# Patient Record
Sex: Female | Born: 1995 | Race: White | Hispanic: No | Marital: Single | Smoking: Never smoker
Health system: Southern US, Community
[De-identification: ages and names within clinical notes are randomized; demographics above are authoritative.]

## PROBLEM LIST (undated history)

## (undated) DIAGNOSIS — M24469 Recurrent dislocation, unspecified knee: Secondary | ICD-10-CM

## (undated) DIAGNOSIS — F70 Mild intellectual disabilities: Secondary | ICD-10-CM

## (undated) DIAGNOSIS — R0981 Nasal congestion: Secondary | ICD-10-CM

## (undated) DIAGNOSIS — H6690 Otitis media, unspecified, unspecified ear: Secondary | ICD-10-CM

## (undated) HISTORY — PX: TYMPANOSTOMY TUBE PLACEMENT: SHX32

## (undated) HISTORY — PX: KNEE SURGERY: SHX244

## (undated) HISTORY — PX: ADENOIDECTOMY: SUR15

---
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
## 2017-12-18 ENCOUNTER — Encounter: Payer: Self-pay | Admitting: Orthopaedic Surgery

## 2017-12-18 ENCOUNTER — Ambulatory Visit (INDEPENDENT_AMBULATORY_CARE_PROVIDER_SITE_OTHER): Payer: Medicaid Other

## 2017-12-18 ENCOUNTER — Ambulatory Visit (INDEPENDENT_AMBULATORY_CARE_PROVIDER_SITE_OTHER): Payer: Medicaid Other | Admitting: Orthopaedic Surgery

## 2017-12-18 VITALS — BP 114/76 | HR 82 | Ht 64.0 in | Wt 129.0 lb

## 2017-12-18 DIAGNOSIS — M25562 Pain in left knee: Secondary | ICD-10-CM

## 2017-12-18 NOTE — Progress Notes (Signed)
Subjective:    Patient ID: Carolyn Benitez, female    DOB: [birth year], 22 y.o.   MRN: [id number]  HPI She fell and hurt her left knee about two weeks ago. She felt a pop and then has had swelling and decreased motion of the knee.  She has been seen at [facility] on 12-04-17 for this and again last week.  She is referred for the pain. She has a history in the past of dislocation of the patella.  She has braces for this.  She has not had the type of pain she has now before.  She has more lateral pain and swelling and pain posterior also.  She cannot fully extend the knee at times.  She has giving way.  She uses a crutch but did not bring it in today.  She is taking ibuprofen which helps.   Review of Systems  Constitutional: Positive for activity change.  Musculoskeletal: Positive for arthralgias, gait problem and joint swelling.  All other systems reviewed and are negative.  For Review of Systems, all other systems reviewed and are negative.  The following is a summary of the past history medically, past history surgically, known current medicines, social history and family history.  This information is gathered electronically by the computer from prior information and documentation.  I review this each visit and have found including this information at this point in the chart is beneficial and informative.   Past Medical History:  Diagnosis Date  . Chronic otitis media 12/2013  . Mild intellectual disability   . Nasal congestion 12/18/2013  . Recurrent dislocation of knee    bilateral    Past Surgical History:  Procedure Laterality Date  . ADENOIDECTOMY    . COLONOSCOPY W/ BIOPSIES  02/10/2010  . MYRINGOTOMY WITH TUBE PLACEMENT Bilateral 12/22/2013   Procedure: BILATERAL MYRINGOTOMY WITH TUBE PLACEMENT;  Surgeon: Darletta Moll, MD;  Location: [facility];  Service: ENT;  Laterality: Bilateral;  . TYMPANOSTOMY TUBE PLACEMENT Bilateral    x 2    No current  outpatient medications on file prior to visit.   No current facility-administered medications on file prior to visit.     Social History   Socioeconomic History  . Marital status: Single    Spouse name: Not on file  . Number of children: Not on file  . Years of education: Not on file  . Highest education level: Not on file  Occupational History  . Not on file  Social Needs  . Financial resource strain: Not on file  . Food insecurity:    Worry: Not on file    Inability: Not on file  . Transportation needs:    Medical: Not on file    Non-medical: Not on file  Tobacco Use  . Smoking status: Never Smoker  . Smokeless tobacco: Never Used  Substance and Sexual Activity  . Alcohol use: No  . Drug use: No  . Sexual activity: Not on file  Lifestyle  . Physical activity:    Days per week: Not on file    Minutes per session: Not on file  . Stress: Not on file  Relationships  . Social connections:    Talks on phone: Not on file    Gets together: Not on file    Attends religious service: Not on file    Active member of club or organization: Not on file    Attends meetings of clubs or organizations: Not on file  Relationship status: Not on file  . Intimate partner violence:    Fear of current or ex partner: Not on file    Emotionally abused: Not on file    Physically abused: Not on file    Forced sexual activity: Not on file  Other Topics Concern  . Not on file  Social History Narrative  . Not on file    Family History  Problem Relation Age of Onset  . Hypertension Paternal Grandfather     BP 114/76   Pulse 82   Ht 5\' 4"  (1.626 m)   Wt 129 lb (58.5 kg)   LMP 12/10/2017 (Exact Date)   BMI 22.14 kg/m   Body mass index is 22.14 kg/m.      Objective:   Physical Exam Constitutional:      Appearance: She is well-developed.  HENT:     Head: Normocephalic and atraumatic.  Eyes:     Conjunctiva/sclera: Conjunctivae normal.     Pupils: Pupils are equal,  round, and reactive to light.  Neck:     Musculoskeletal: Normal range of motion and neck supple.  Cardiovascular:     Rate and Rhythm: Normal rate and regular rhythm.  Pulmonary:     Effort: Pulmonary effort is normal.  Abdominal:     Palpations: Abdomen is soft.  Musculoskeletal:     Left knee: She exhibits decreased range of motion, swelling and effusion. Tenderness found. Lateral joint line tenderness noted.       Legs:  Skin:    General: Skin is warm and dry.  Neurological:     Mental Status: She is alert and oriented to person, place, and time.     Cranial Nerves: No cranial nerve deficit.     Motor: No abnormal muscle tone.     Coordination: Coordination normal.     Deep Tendon Reflexes: Reflexes are normal and symmetric. Reflexes normal.  Psychiatric:        Behavior: Behavior normal.        Thought Content: Thought content normal.        Judgment: Judgment normal.      X-rays were done of the left knee, reported separately.     Assessment & Plan:   Encounter Diagnosis  Name Primary?  . Acute pain of left knee Yes   I am concerned about a meniscus tear. Her insurance requires a six week waiting period.  I have explained this to her.  I will see her in three weeks.  Use the crutch, continue the ibuprofen.  Call if any problem.  Precautions discussed.   Electronically Signed Darreld McleanWayne Tatum Corl, MD 12/18/201911:00 AM

## 2018-01-08 ENCOUNTER — Encounter: Payer: Self-pay | Admitting: Orthopaedic Surgery

## 2018-01-08 ENCOUNTER — Ambulatory Visit (INDEPENDENT_AMBULATORY_CARE_PROVIDER_SITE_OTHER): Payer: Medicaid Other | Admitting: Orthopaedic Surgery

## 2018-01-08 VITALS — BP 108/65 | HR 62 | Ht 64.0 in | Wt 127.0 lb

## 2018-01-08 DIAGNOSIS — M25562 Pain in left knee: Secondary | ICD-10-CM

## 2018-01-08 NOTE — Progress Notes (Signed)
[name], female DOB:Aug 22, 1995, 23 y.o. AVW:[id number]  Chief Complaint  Patient presents with  . Knee Pain    Left knee pain    HPI  Carolyn Benitez is a 23 y.o. female who continues to have left knee pain.  She is a little better but still has medial pain and feeling the knee may give way.  She has swelling and popping.  Her insurance requires six weeks from first seen and she needs an additional two weeks before an MRI can be ordered.  She is taking Advil which helps.   Body mass index is 21.8 kg/m.  ROS  Review of Systems  Constitutional: Positive for activity change.  Musculoskeletal: Positive for arthralgias, gait problem and joint swelling.  All other systems reviewed and are negative.   All other systems reviewed and are negative.  The following is a summary of the past history medically, past history surgically, known current medicines, social history and family history.  This information is gathered electronically by the computer from prior information and documentation.  I review this each visit and have found including this information at this point in the chart is beneficial and informative.    Past Medical History:  Diagnosis Date  . Chronic otitis media 12/2013  . Mild intellectual disability   . Nasal congestion 12/18/2013  . Recurrent dislocation of knee    bilateral    Past Surgical History:  Procedure Laterality Date  . ADENOIDECTOMY    . COLONOSCOPY W/ BIOPSIES  02/10/2010  . MYRINGOTOMY WITH TUBE PLACEMENT Bilateral 12/22/2013   Procedure: BILATERAL MYRINGOTOMY WITH TUBE PLACEMENT;  Surgeon: Darletta MollSui W Teoh, MD;  Location: [facility];  Service: ENT;  Laterality: Bilateral;  . TYMPANOSTOMY TUBE PLACEMENT Bilateral    x 2    Family History  Problem Relation Age of Onset  . Hypertension Paternal Grandfather     Social History Social History   Tobacco Use  . Smoking status: Never Smoker  . Smokeless tobacco: Never Used   Substance Use Topics  . Alcohol use: No  . Drug use: No    No Known Allergies  No current outpatient medications on file.   No current facility-administered medications for this visit.      Physical Exam  Blood pressure 108/65, pulse 62, height 5\' 4"  (1.626 m), weight 127 lb (57.6 kg), last menstrual period 12/10/2017.  Constitutional: overall normal hygiene, normal nutrition, well developed, normal grooming, normal body habitus. Assistive device:none  Musculoskeletal: gait and station Limp left, muscle tone and strength are normal, no tremors or atrophy is present.  .  Neurological: coordination overall normal.  Deep tendon reflex/nerve stretch intact.  Sensation normal.  Cranial nerves II-XII intact.   Skin:   Normal overall no scars, lesions, ulcers or rashes. No psoriasis.  Psychiatric: Alert and oriented x 3.  Recent memory intact, remote memory unclear.  Normal mood and affect. Well groomed.  Good eye contact.  Cardiovascular: overall no swelling, no varicosities, no edema bilaterally, normal temperatures of the legs and arms, no clubbing, cyanosis and good capillary refill.  Lymphatic: palpation is normal.  Left knee has medial joint pain, slight effusion, ROM is full, knee is stable, slight limp on the left, NV intact.  All other systems reviewed and are negative   The patient has been educated about the nature of the problem(s) and counseled on treatment options.  The patient appeared to understand what I have discussed and is in agreement with it.  Encounter  Diagnosis  Name Primary?  . Acute pain of left knee Yes    PLAN Call if any problems.  Precautions discussed.  Continue current medications.   Return to clinic 2 weeks   Consider MRI if pain continues.  Electronically Signed Darreld Mclean, MD 1/8/20209:28 AM

## 2018-01-22 ENCOUNTER — Encounter: Payer: Self-pay | Admitting: Orthopaedic Surgery

## 2018-01-22 ENCOUNTER — Ambulatory Visit (INDEPENDENT_AMBULATORY_CARE_PROVIDER_SITE_OTHER): Payer: Medicaid Other | Admitting: Orthopaedic Surgery

## 2018-01-22 VITALS — BP 114/71 | HR 75 | Ht 64.0 in | Wt 133.0 lb

## 2018-01-22 DIAGNOSIS — M25562 Pain in left knee: Secondary | ICD-10-CM | POA: Diagnosis not present

## 2018-01-22 NOTE — Progress Notes (Signed)
Patient Carolyn Benitez, female DOB:09-14-1995, 23 y.o. ESP:[id number]  Chief Complaint  Patient presents with  . Knee Pain    Left knee pain.    HPI  Carolyn Benitez is a 23 y.o. female ;who continues to have pain of the left knee. She is a little better but still has pain and popping.  She has less swelling.  I will get a MRI of the left knee.  She has not improved that much.   Body mass index is 22.83 kg/m.  ROS  Review of Systems  Constitutional: Positive for activity change.  Musculoskeletal: Positive for arthralgias, gait problem and joint swelling.  All other systems reviewed and are negative.   All other systems reviewed and are negative.  The following is a summary of the past history medically, past history surgically, known current medicines, social history and family history.  This information is gathered electronically by the computer from prior information and documentation.  I review this each visit and have found including this information at this point in the chart is beneficial and informative.    Past Medical History:  Diagnosis Date  . Chronic otitis media 12/2013  . Mild intellectual disability   . Nasal congestion 12/18/2013  . Recurrent dislocation of knee    bilateral    Past Surgical History:  Procedure Laterality Date  . ADENOIDECTOMY    . COLONOSCOPY W/ BIOPSIES  02/10/2010  . MYRINGOTOMY WITH TUBE PLACEMENT Bilateral 12/22/2013   Procedure: BILATERAL MYRINGOTOMY WITH TUBE PLACEMENT;  Surgeon: Darletta Moll, MD;  Location: [facility];  Service: ENT;  Laterality: Bilateral;  . TYMPANOSTOMY TUBE PLACEMENT Bilateral    x 2    Family History  Problem Relation Age of Onset  . Hypertension Paternal Grandfather     Social History Social History   Tobacco Use  . Smoking status: Never Smoker  . Smokeless tobacco: Never Used  Substance Use Topics  . Alcohol use: No  . Drug use: No    No Known Allergies  No current outpatient  medications on file.   No current facility-administered medications for this visit.      Physical Exam  Blood pressure 114/71, pulse 75, height 5\' 4"  (1.626 m), weight 133 lb (60.3 kg).  Constitutional: overall normal hygiene, normal nutrition, well developed, normal grooming, normal body habitus. Assistive device:none  Musculoskeletal: gait and station Limp left slightly, muscle tone and strength are normal, no tremors or atrophy is present.  .  Neurological: coordination overall normal.  Deep tendon reflex/nerve stretch intact.  Sensation normal.  Cranial nerves II-XII intact.   Skin:   Normal overall no scars, lesions, ulcers or rashes. No psoriasis.  Psychiatric: Alert and oriented x 3.  Recent memory intact, remote memory unclear.  Normal mood and affect. Well groomed.  Good eye contact.  Cardiovascular: overall no swelling, no varicosities, no edema bilaterally, normal temperatures of the legs and arms, no clubbing, cyanosis and good capillary refill.  Lymphatic: palpation is normal.  Left knee is tender, slight effusion, ROM 0 to 110, no crepitus, weakly positive medial [place], NV intact.  All other systems reviewed and are negative   The patient has been educated about the nature of the problem(s) and counseled on treatment options.  The patient appeared to understand what I have discussed and is in agreement with it.  Encounter Diagnosis  Name Primary?  . Acute pain of left knee Yes    PLAN Call if any problems.  Precautions discussed.  Continue current medications.   Return to clinic after MRI of her left knee   Electronically Signed Darreld Mclean, MD 1/22/20203:13 PM'

## 2018-02-19 ENCOUNTER — Ambulatory Visit (INDEPENDENT_AMBULATORY_CARE_PROVIDER_SITE_OTHER): Payer: Medicaid Other | Admitting: Orthopaedic Surgery

## 2018-02-19 ENCOUNTER — Encounter: Payer: Self-pay | Admitting: Orthopaedic Surgery

## 2018-02-19 VITALS — BP 117/71 | HR 73 | Ht 64.0 in | Wt 133.0 lb

## 2018-02-19 DIAGNOSIS — M25562 Pain in left knee: Secondary | ICD-10-CM | POA: Diagnosis not present

## 2018-02-19 NOTE — Progress Notes (Signed)
Patient Carolyn Benitez, female DOB:04-28-1995, 23 y.o. OXB:[id number]  Chief Complaint  Patient presents with  . Knee Pain    HPI  Carolyn Benitez is a 23 y.o. female who has had left knee pain.  She had a MRI which showed a bone contusion of posterior lateral tibial plateau.  I have explained the findings.  She has no meniscus tear.  She is better.  I have recommended she limit some activities for another month.   Body mass index is 22.83 kg/m.  ROS  Review of Systems  Constitutional: Positive for activity change.  Musculoskeletal: Positive for arthralgias, gait problem and joint swelling.  All other systems reviewed and are negative.   All other systems reviewed and are negative.  The following is a summary of the past history medically, past history surgically, known current medicines, social history and family history.  This information is gathered electronically by the computer from prior information and documentation.  I review this each visit and have found including this information at this point in the chart is beneficial and informative.    Past Medical History:  Diagnosis Date  . Chronic otitis media 12/2013  . Mild intellectual disability   . Nasal congestion 12/18/2013  . Recurrent dislocation of knee    bilateral    Past Surgical History:  Procedure Laterality Date  . ADENOIDECTOMY    . COLONOSCOPY W/ BIOPSIES  02/10/2010  . MYRINGOTOMY WITH TUBE PLACEMENT Bilateral 12/22/2013   Procedure: BILATERAL MYRINGOTOMY WITH TUBE PLACEMENT;  Surgeon: Darletta Moll, MD;  Location: [facility];  Service: ENT;  Laterality: Bilateral;  . TYMPANOSTOMY TUBE PLACEMENT Bilateral    x 2    Family History  Problem Relation Age of Onset  . Hypertension Paternal Grandfather     Social History Social History   Tobacco Use  . Smoking status: Never Smoker  . Smokeless tobacco: Never Used  Substance Use Topics  . Alcohol use: No  . Drug use: No    No Known  Allergies  No current outpatient medications on file.   No current facility-administered medications for this visit.      Physical Exam  Blood pressure 117/71, pulse 73, height 5\' 4"  (1.626 m), weight 133 lb (60.3 kg).  Constitutional: overall normal hygiene, normal nutrition, well developed, normal grooming, normal body habitus. Assistive device:none  Musculoskeletal: gait and station Limp none, muscle tone and strength are normal, no tremors or atrophy is present.  .  Neurological: coordination overall normal.  Deep tendon reflex/nerve stretch intact.  Sensation normal.  Cranial nerves II-XII intact.   Skin:   Normal overall no scars, lesions, ulcers or rashes. No psoriasis.  Psychiatric: Alert and oriented x 3.  Recent memory intact, remote memory unclear.  Normal mood and affect. Well groomed.  Good eye contact.  Cardiovascular: overall no swelling, no varicosities, no edema bilaterally, normal temperatures of the legs and arms, no clubbing, cyanosis and good capillary refill.  Lymphatic: palpation is normal.  All other systems reviewed and are negative   Left knee has full ROM today and no pain.  The patient has been educated about the nature of the problem(s) and counseled on treatment options.  The patient appeared to understand what I have discussed and is in agreement with it.  Encounter Diagnosis  Name Primary?  . Acute pain of left knee Yes    PLAN Call if any problems.  Precautions discussed.  Continue current medications.   Return to clinic 1 month  Electronically Signed Darreld Mclean, MD 2/19/20203:10 PM

## 2018-03-19 ENCOUNTER — Ambulatory Visit: Payer: Medicaid Other | Admitting: Orthopaedic Surgery

## 2018-03-20 ENCOUNTER — Encounter: Payer: Self-pay | Admitting: Orthopaedic Surgery

## 2018-08-27 LAB — BASIC METABOLIC PANEL
BUN: 9 (ref 4–21)
Creatinine: 0.6 (ref <=1.1)

## 2018-11-03 ENCOUNTER — Other Ambulatory Visit: Payer: Self-pay

## 2018-11-03 ENCOUNTER — Ambulatory Visit: Payer: Medicaid Other | Admitting: Podiatry

## 2018-11-03 VITALS — BP 126/80 | HR 94

## 2018-11-03 DIAGNOSIS — L600 Ingrowing nail: Secondary | ICD-10-CM

## 2018-11-03 MED ORDER — GENTAMICIN SULFATE 0.1 % EX CREA: 1.0000 "application " | TOPICAL_CREAM | Freq: Two times a day (BID) | CUTANEOUS | 1 refills | Status: DC

## 2018-11-06 NOTE — Progress Notes (Signed)
   Subjective: Patient presents today for evaluation of intermittent throbbing, sharp pain to the lateral borders of the bilateral great toes that has been ongoing for the past year. She reports associated redness, swelling and drainage from the left hallux. Patient is concerned for possible ingrown nail. Touching the toes makes the pain worse. She has used Fluocinonide ointment and has been on oral antibiotics for treatment. Patient presents today for further treatment and evaluation.  Past Medical History:  Diagnosis Date  . Chronic otitis media 12/2013  . Mild intellectual disability   . Nasal congestion 12/18/2013  . Recurrent dislocation of knee    bilateral    Objective:  General: Well developed, nourished, in no acute distress, alert and oriented x3   Dermatology: Skin is warm, dry and supple bilateral. Lateral borders of the bilateral great toes appears to be erythematous with evidence of an ingrowing nail. Pain on palpation noted to the border of the nail fold. The remaining nails appear unremarkable at this time. There are no open sores, lesions.  Vascular: Dorsalis Pedis artery and Posterior Tibial artery pedal pulses palpable. No lower extremity edema noted.   Neruologic: Grossly intact via light touch bilateral.  Musculoskeletal: Muscular strength within normal limits in all groups bilateral. Normal range of motion noted to all pedal and ankle joints.   Assesement: #1 Paronychia with ingrowing nail lateral border bilateral great toenails  #2 Pain in toe #3 Incurvated nail  Plan of Care:  1. Patient evaluated.  2. Discussed treatment alternatives and plan of care. Explained nail avulsion procedure and post procedure course to patient. 3. Patient opted for permanent partial nail avulsion of the lateral borders bilateral great toenails.  4. Prior to procedure, local anesthesia infiltration utilized using 3 ml of a 50:50 mixture of 2% plain lidocaine and 0.5% plain marcaine  in a normal hallux block fashion and a betadine prep performed.  5. Partial permanent nail avulsion with chemical matrixectomy performed using [id number] applications of phenol followed by alcohol flush.  6. Light dressing applied. 7. Continue taking oral Keflex as directed by PCP.  8. Prescription for Gentamicin cream provided to patient to use daily with a bandage.  9. Return to clinic in 2 weeks.  Edrick Kins, DPM [facility]  Dr. Edrick Kins, [address]                                        [place], [place] [address]                Office [phone]  Fax [phone]

## 2018-11-26 ENCOUNTER — Ambulatory Visit: Payer: Medicaid Other | Admitting: Podiatry

## 2018-12-01 ENCOUNTER — Ambulatory Visit: Payer: Medicaid Other | Admitting: Podiatry

## 2018-12-15 ENCOUNTER — Ambulatory Visit: Payer: Medicaid Other | Admitting: Podiatry

## 2018-12-15 ENCOUNTER — Other Ambulatory Visit: Payer: Self-pay

## 2018-12-15 DIAGNOSIS — L600 Ingrowing nail: Secondary | ICD-10-CM | POA: Diagnosis not present

## 2018-12-18 NOTE — Progress Notes (Signed)
   Subjective: 23 y.o. female presents today status post permanent nail avulsion procedure of the lateral borders of the bilateral great toes that was performed on 11/03/2018. She states she is doing well. She reports some tenderness of the areas when she wears certain shoes. She has taken the Keflex and has been using the Gentamicin cream as directed. Patient is here for further evaluation and treatment.    Past Medical History:  Diagnosis Date  . Chronic otitis media 12/2013  . Mild intellectual disability   . Nasal congestion 12/18/2013  . Recurrent dislocation of knee    bilateral    Objective: Skin is warm, dry and supple. Nail and respective nail fold appears to be healing appropriately. Open wound to the associated nail fold with a granular wound base and moderate amount of fibrotic tissue. Minimal drainage noted. Mild erythema around the periungual region likely due to phenol chemical matricectomy.  Assessment: #1 postop permanent partial nail avulsion lateral borders of the bilateral great toes  #2 open wound periungual nail fold of respective digit.   Plan of care: #1 patient was evaluated  #2 debridement of open wound was performed to the periungual border of the respective toe using a currette. Antibiotic ointment and Band-Aid was applied. #3 patient is to return to clinic on a PRN basis.   Edrick Kins, DPM [facility]  Dr. Edrick Kins, [address]                                        [place], [place] [address]                Office [phone]  Fax [phone]

## 2019-01-22 LAB — TSH: TSH: 3.13 (ref 0.41–5.90)

## 2019-01-22 LAB — VITAMIN D 25 HYDROXY (VIT D DEFICIENCY, FRACTURES): Vit D, 25-Hydroxy: 63

## 2019-03-12 ENCOUNTER — Ambulatory Visit (INDEPENDENT_AMBULATORY_CARE_PROVIDER_SITE_OTHER): Payer: Medicaid Other | Admitting: "Endocrinology

## 2019-03-12 ENCOUNTER — Other Ambulatory Visit: Payer: Self-pay

## 2019-03-12 ENCOUNTER — Encounter: Payer: Self-pay | Admitting: "Endocrinology

## 2019-03-12 VITALS — BP 102/65 | HR 73 | Ht 64.0 in | Wt 151.0 lb

## 2019-03-12 DIAGNOSIS — R61 Generalized hyperhidrosis: Secondary | ICD-10-CM

## 2019-03-12 NOTE — Progress Notes (Signed)
Endocrinology Consult Note                                            03/13/2019, 8:44 AM   Subjective:    Patient ID: Carolyn Benitez, female    DOB: [birth year], PCP Carolyn Downer, NP   Past Medical History:  Diagnosis Date  . Chronic otitis media 12/2013  . Mild intellectual disability   . Nasal congestion 12/18/2013  . Recurrent dislocation of knee    bilateral   Past Surgical History:  Procedure Laterality Date  . ADENOIDECTOMY    . COLONOSCOPY W/ BIOPSIES  02/10/2010  . MYRINGOTOMY WITH TUBE PLACEMENT Bilateral 12/22/2013   Procedure: BILATERAL MYRINGOTOMY WITH TUBE PLACEMENT;  Surgeon: Darletta Moll, MD;  Location: [facility];  Service: ENT;  Laterality: Bilateral;  . TYMPANOSTOMY TUBE PLACEMENT Bilateral    x 2   Social History   Socioeconomic History  . Marital status: Single    Spouse name: Not on file  . Number of children: Not on file  . Years of education: Not on file  . Highest education level: Not on file  Occupational History  . Not on file  Tobacco Use  . Smoking status: Never Smoker  . Smokeless tobacco: Never Used  Substance and Sexual Activity  . Alcohol use: No  . Drug use: No  . Sexual activity: Not on file  Other Topics Concern  . Not on file  Social History Narrative  . Not on file   Social Determinants of Health   Financial Resource Strain:   . Difficulty of Paying Living Expenses:   Food Insecurity:   . Worried About Programme researcher, broadcasting/film/video in the Last Year:   . Barista in the Last Year:   Transportation Needs:   . Freight forwarder (Medical):   Marland Kitchen Lack of Transportation (Non-Medical):   Physical Activity:   . Days of Exercise per Week:   . Minutes of Exercise per Session:   Stress:   . Feeling of Stress :   Social Connections:   . Frequency of Communication with Friends and Family:   . Frequency of Social Gatherings with Friends and Family:   . Attends Religious Services:   . Active Member of  Clubs or Organizations:   . Attends Banker Meetings:   Marland Kitchen Marital Status:    Family History  Problem Relation Age of Onset  . Hypertension Paternal Grandfather    Outpatient Encounter Medications as of 03/12/2019  Medication Sig  . escitalopram (LEXAPRO) 20 MG tablet Take 20 mg by mouth daily.  . cetirizine (ZYRTEC) 10 MG tablet Take by mouth.  Marland Kitchen gentamicin cream (GARAMYCIN) 0.1 % Apply 1 application topically 2 (two) times daily.  Marland Kitchen levonorgestrel-ethinyl estradiol (ALESSE) 0.1-20 MG-MCG tablet Take by mouth.   No facility-administered encounter medications on file as of 03/12/2019.   ALLERGIES: No Known Allergies  VACCINATION STATUS:  There is no immunization history on file for this patient.  HPI Carolyn Benitez is 24 y.o. female who presents today with a medical history as above. she is being seen in consultation for hyperhidrosis requested by Carolyn Downer, NP.  she is a patient with documented history of anxiety/intellectual disability, accompanied by her mother to the clinic.  The family denies any prior history of endocrine dysfunction,  denies thyroid, adrenal, thyroid dysfunction.  She was started on antidepressant Lexapro approximately a year ago.  She has been dealing with mainly night sweats for the last 3 to 4 months.  She denies fever, chills.  She has no exposure to infectious conditions such as tuberculosis.  She denies wheezing, diarrhea.  Review of her medical records reveals recent weight gain of approximately 15 pounds in the last year.  Her recent thyroid function test on January 22, 2019 was within normal limits.  Her A1c was 5.1% in August 2020.  She has normal CMP, slightly lower hemoglobin of 11.6. She is also on OCP.  Review of Systems  Constitutional: + recent weight gain, no fatigue, no subjective hyperthermia, no subjective hypothermia Eyes: no blurry vision, no xerophthalmia ENT: no sore throat, no nodules palpated in throat, no  dysphagia/odynophagia, no hoarseness Cardiovascular: no Chest Pain, no Shortness of Breath, no palpitations, no leg swelling Respiratory: no cough, no shortness of breath Gastrointestinal: no Nausea/Vomiting/Diarhhea Musculoskeletal: no muscle/joint aches Skin: no rashes Neurological: no tremors, no numbness, no tingling, no dizziness Psychiatric: no depression, no anxiety  Objective:    Vitals with BMI 03/12/2019 11/03/2018 02/19/2018  Height 5\' 4"  - 5\' 4"   Weight 151 lbs - 133 lbs  BMI 01.60 - 10.93  Systolic 235 573 220  Diastolic 65 80 71  Pulse 73 94 73    BP 102/65   Pulse 73   Ht 5\' 4"  (1.626 m)   Wt 151 lb (68.5 kg)   BMI 25.92 kg/m   Wt Readings from Last 3 Encounters:  03/12/19 151 lb (68.5 kg)  02/19/18 133 lb (60.3 kg)  01/22/18 133 lb (60.3 kg)    Physical Exam  Constitutional:  Body mass index is 25.92 kg/m.,  not in acute distress, normal state of mind Eyes: PERRLA, EOMI, no exophthalmos ENT: moist mucous membranes, no gross thyromegaly, no gross cervical lymphadenopathy, -dorsal cervical fat pad. Cardiovascular: normal precordial activity, Regular Rate and Rhythm, no Murmur/Rubs/Gallops Respiratory:  adequate breathing efforts, no gross chest deformity, Clear to auscultation bilaterally Gastrointestinal: abdomen soft, Non -tender, No distension, Bowel Sounds present, no gross organomegaly Musculoskeletal: no gross deformities, strength intact in all four extremities Skin: moist, warm, no rashes Neurological: no tremor with outstretched hands, Deep tendon reflexes normal in bilateral lower extremities.    Recent Results (from the past 2160 hour(s))  VITAMIN D 25 Hydroxy (Vit-D Deficiency, Fractures)     Status: None   Collection Time: 01/22/19 12:00 AM  Result Value Ref Range   Vit D, 25-Hydroxy 63   TSH     Status: None   Collection Time: 01/22/19 12:00 AM  Result Value Ref Range   TSH 3.13 0.41 - 5.90    Comment: free t4 1.4     Assessment &  Plan:   1. Hyperhidrosis - Carolyn Benitez  is being seen at a kind request of Carolyn Rival, NP. - I have reviewed her available endocrine records records and clinically evaluated the patient. - Based on these reviews, she has nonspecific hyperhidrosis,  however,  there is not sufficient information to proceed with definitive treatment plan.  She does not seem to have infectious cause of hyperhidrosis at this time. -It is unlikely that she has endocrine dysfunction to cause hyperhidrosis at this time. One possible etiology is her new medication, Lexapro, which is known to cause sweating as a side effect, increasing serotonin levels in the brain. - she will need some basic endocrine work-up to  rule out important for rare endocrine conditions known to cause hyperhidrosis such as pheochromocytoma, carcinoid's.   She will be sent to lab for:  Metanephrines, plasma - 5 HIAA w/Creatinine, 24 hr. - Catecholamines, fractionated, urine, 24 hour; Future - Metanephrines, urine, 24 hour; Future - Catecholamines, fractionated, urine, 24 hour - Metanephrines, urine, 24 hour  If her labs are unremarkable, she should be considered for a different antidepressant with less side effects of excessive sweating.  - I did not initiate any new prescriptions today. - she is advised to maintain close follow up with Carolyn Downer, NP for primary care needs.   - Time spent with the patient: 60 minutes, of which >50% was spent in  counseling her about her hyperhidrosis and the rest in obtaining information about her symptoms, reviewing her previous labs/studies ( including abstractions from other facilities),  evaluations, and treatments,  and developing a plan to confirm diagnosis and Ishaq term treatment based on the latest standards of care/guidelines; and documenting her care.  Jonette Eva Folkert participated in the discussions, expressed understanding, and voiced agreement with the above plans.  All questions were  answered to her satisfaction. she is encouraged to contact clinic should she have any questions or concerns prior to her return visit.  Follow up plan: Return in about 10 days (around 03/22/2019), or with 24 hour urine studies, she goes to lab today.Marquis Lunch, MD [facility] [address] [place], [place] [address] Phone: [phone]  Fax: [phone]     03/13/2019, 8:44 AM  This note was partially dictated with voice recognition software. Similar sounding words can be transcribed inadequately or may not  be corrected upon review.

## 2019-03-20 LAB — CATECHOLAMINES, FRACTIONATED, URINE, 24 HOUR
Calc Total (E+NE): 32 mcg/24 h (ref 26–121)
Creatinine, Urine mg/day-CATEUR: 1.03 g/(24.h) (ref 0.50–2.15)
Dopamine 24 Hr Urine: 181 mcg/24 h (ref 52–480)
Norepinephrine, 24H, Ur: 32 mcg/24 h (ref 15–100)
Total Volume: 1250 mL

## 2019-03-20 LAB — METANEPHRINES, URINE, 24 HOUR
Metaneph Total, Ur: 243 mcg/24 h (ref 94–604)
Metanephrines, Ur: 69 mcg/24 h (ref 25–222)
Normetanephrine, 24H Ur: 174 mcg/24 h (ref 40–412)
Volume, Urine-VMAUR: 1250 mL

## 2019-03-20 LAB — METANEPHRINES, PLASMA
Metanephrine, Free: <25 pg/mL (ref <=57)
Normetanephrine, Free: 85 pg/mL (ref <=148)
Total Metanephrines-Plasma: 85 pg/mL (ref <=205)

## 2019-03-20 LAB — 5 HIAA W/CREATININE, 24 HR
5 HIAA, 24 Hour Urine: 3.1 mg/24 h (ref <=6.0)
Creatinine: 1.03 g/(24.h) (ref 0.50–2.15)
Volume, Urine-VMAUR: 1250 mL

## 2019-03-26 ENCOUNTER — Ambulatory Visit (INDEPENDENT_AMBULATORY_CARE_PROVIDER_SITE_OTHER): Payer: Medicaid Other | Admitting: "Endocrinology

## 2019-03-26 ENCOUNTER — Other Ambulatory Visit: Payer: Self-pay

## 2019-03-26 ENCOUNTER — Encounter: Payer: Self-pay | Admitting: "Endocrinology

## 2019-03-26 VITALS — BP 112/72 | HR 73 | Ht 64.0 in | Wt 151.4 lb

## 2019-03-26 DIAGNOSIS — R61 Generalized hyperhidrosis: Secondary | ICD-10-CM | POA: Diagnosis not present

## 2019-03-26 NOTE — Progress Notes (Signed)
03/26/2019, 2:37 PM  Endocrinology follow-up note   Subjective:    Patient ID: Carolyn Benitez, female    DOB: 1995-12-04, PCP Erasmo Downer, NP   Past Medical History:  Diagnosis Date  . Chronic otitis media 12/2013  . Mild intellectual disability   . Nasal congestion 12/18/2013  . Recurrent dislocation of knee    bilateral   Past Surgical History:  Procedure Laterality Date  . ADENOIDECTOMY    . COLONOSCOPY W/ BIOPSIES  02/10/2010  . MYRINGOTOMY WITH TUBE PLACEMENT Bilateral 12/22/2013   Procedure: BILATERAL MYRINGOTOMY WITH TUBE PLACEMENT;  Surgeon: Darletta Moll, MD;  Location: [facility];  Service: ENT;  Laterality: Bilateral;  . TYMPANOSTOMY TUBE PLACEMENT Bilateral    x 2   Social History   Socioeconomic History  . Marital status: Single    Spouse name: Not on file  . Number of children: Not on file  . Years of education: Not on file  . Highest education level: Not on file  Occupational History  . Not on file  Tobacco Use  . Smoking status: Never Smoker  . Smokeless tobacco: Never Used  Substance and Sexual Activity  . Alcohol use: No  . Drug use: No  . Sexual activity: Not on file  Other Topics Concern  . Not on file  Social History Narrative  . Not on file   Social Determinants of Health   Financial Resource Strain:   . Difficulty of Paying Living Expenses:   Food Insecurity:   . Worried About Programme researcher, broadcasting/film/video in the Last Year:   . Barista in the Last Year:   Transportation Needs:   . Freight forwarder (Medical):   Marland Kitchen Lack of Transportation (Non-Medical):   Physical Activity:   . Days of Exercise per Week:   . Minutes of Exercise per Session:   Stress:   . Feeling of Stress :   Social Connections:   . Frequency of Communication with Friends and Family:   . Frequency of Social Gatherings with Friends and Family:   . Attends Religious Services:   . Active Member of  Clubs or Organizations:   . Attends Banker Meetings:   Marland Kitchen Marital Status:    Family History  Problem Relation Age of Onset  . Hypertension Paternal Grandfather    Outpatient Encounter Medications as of 03/26/2019  Medication Sig  . cetirizine (ZYRTEC) 10 MG tablet Take by mouth.  . escitalopram (LEXAPRO) 20 MG tablet Take 20 mg by mouth daily.  Marland Kitchen gentamicin cream (GARAMYCIN) 0.1 % Apply 1 application topically 2 (two) times daily.  Marland Kitchen levonorgestrel-ethinyl estradiol (ALESSE) 0.1-20 MG-MCG tablet Take by mouth.   No facility-administered encounter medications on file as of 03/26/2019.   ALLERGIES: No Known Allergies  VACCINATION STATUS:  There is no immunization history on file for this patient.  HPI Carolyn Benitez is 24 y.o. female who returns today for follow-up after she was sent for endocrine work-up given her complaint of hyperhidrosis.    PCP:  Erasmo Downer, NP.  she is a patient with documented history of anxiety/intellectual disability, accompanied by her mother to the clinic.  The family denies any prior history  of endocrine dysfunction, denies thyroid, adrenal, thyroid dysfunction.  She was started on antidepressant Lexapro approximately a year ago.  She has been dealing with mainly night sweats for the last 3 to 4 months.  She denies fever, chills.  She has no exposure to infectious conditions such as tuberculosis.  She denies wheezing, diarrhea.  Review of her medical records reveals recent weight gain of approximately 15 pounds in the last year.   Her blood metanephrines, 24-hour urine for metanephrines and catecholamines and 5-HIAA are all within normal limits.  Her recent thyroid function test will also normal.  She does not have diabetes, Remains on OCP.  Review of Systems   Objective:    Vitals with BMI 03/26/2019 03/12/2019 11/03/2018  Height 5\' 4"  5\' 4"  -  Weight 151 lbs 6 oz 151 lbs -  BMI 25.98 25.91 -  Systolic 112 102  Diastolic 72 65 80   Pulse 73 73 94    BP 112/72   Pulse 73   Ht 5\' 4"  (1.626 m)   Wt 151 lb 6.4 oz (68.7 kg)   BMI 25.99 kg/m   Wt Readings from Last 3 Encounters:  03/26/19 151 lb 6.4 oz (68.7 kg)  03/12/19 151 lb (68.5 kg)  02/19/18 133 lb (60.3 kg)    Physical Exam   Physical Exam- Limited  Constitutional:  Body mass index is 25.99 kg/m. , not in acute distress, normal state of mind Eyes:  EOMI, no exophthalmos Neck: Supple Thyroid: No gross goiter Respiratory: Adequate breathing efforts Musculoskeletal: no gross deformities, strength intact in all four extremities, no gross restriction of joint movements Skin:  no rashes, no hyperemia Neurological: no tremor with outstretched hands,    Recent Results (from the past 2160 hour(s))  VITAMIN D 25 Hydroxy (Vit-D Deficiency, Fractures)     Status: None   Collection Time: 01/22/19 12:00 AM  Result Value Ref Range   Vit D, 25-Hydroxy 63   TSH     Status: None   Collection Time: 01/22/19 12:00 AM  Result Value Ref Range   TSH 3.13 0.41 - 5.90    Comment: free t4 1.4  Metanephrines, plasma     Status: None   Collection Time: 03/16/19 10:51 AM  Result Value Ref Range   Metanephrine, Free <25 <=57 pg/mL    Comment: . This test was developed and its analytical performance characteristics have been determined by [facility], 03/18/19. It has not been cleared or approved by the U.S. Food and Drug Administration. This assay has been validated pursuant to the CLIA regulations and is used for clinical purposes. .    Normetanephrine, Free 85 <=148 pg/mL    Comment: . This test was developed and its analytical performance characteristics have been determined by [facility], [place]. It has not been cleared or approved by the U.S. Food and Drug Administration. This assay has been validated pursuant to the CLIA regulations and is used for clinical purposes. .    Total  Metanephrines-Plasma 85 <=205 pg/mL    Comment: . For additional information, please refer to http://education.questdiagnostics.com/faq/MetFractFree (This link is being provided for informational/educatio informational/educational purposes only.) . Elevations >4-fold upper reference range: strongly suggestive of a pheochromocytoma(1). . Elevations >1- 4-fold upper reference range: significant but not diagnostic, may be due to medications or stress. Suggest running 24 hr urine fractionated metanephrines and/or serum Chromagranin A for confirmation. . Reference: . (1)Algeciras-Schimnich A et al, Plasma Chromogranin A or Urine  Fractionated Metanephrines Follow-Up Testing Improves the Diagnostic Accuracy of Plasma Fractionated Metanephrines for Pheochromocytoma. The Journal of Clinical Endocrinology # Metabolism 93(1), 91-95, 2008. . . . This test was developed and its analytical performance characteristics have been determined by [place], [place]. It has not been cleared or a pproved by the U.S. Food and Drug Administration. This assay has been validated pursuant to the CLIA regulations and is used for clinical purposes. .   5 HIAA w/Creatinine, 24 hr.     Status: None   Collection Time: 03/16/19 10:51 AM  Result Value Ref Range   Volume, Urine-VMAUR 1,250 mL   5 HIAA, 24 Hour Urine 3.1 <=6.0 mg/24 h    Comment: . This test was developed and its analytical performance characteristics have been determined by [place], [place]. It has not been cleared or approved by the U.S. Food and Drug Administration. This assay has been validated pursuant to the CLIA regulations and is used for clinical purposes. .    Creatinine 1.03 0.50 - 2.15 g/24 h  Metanephrines, urine, 24 hour     Status: None   Collection Time: 03/16/19 10:51 AM  Result Value Ref Range   Volume, Urine-VMAUR 1,250 mL   Metanephrines, Ur 69 25  - 222 mcg/24 h    Comment: . This test was developed and its analytical performance characteristics have been determined by [place], [place]. It has not been cleared or approved by the U.S. Food and Drug Administration. This assay has been validated pursuant to the CLIA regulations and is used for clinical purposes. .    Normetanephrine, 24H Ur 174 40 - 412 mcg/24 h    Comment: . This test was developed and its analytical performance characteristics have been determined by [place], [place]. It has not been cleared or approved by the U.S. Food and Drug Administration. This assay has been validated pursuant to the CLIA regulations and is used for clinical purposes. Cathey Endow Total, Ur 243 94 - 604 mcg/24 h    Comment: . A four fold elevation of urinary normetanephrines is extremely likely to be due to a tumor, while a four fold elevation of urinary metanephrines is highly suggestive, but not diagnostic of the tumor. Measurement of plasma Metanephrines and Chromogranin A is recommended for confirmation. .   Catecholamines, fractionated, urine, 24 hour     Status: None   Collection Time: 03/16/19 10:51 AM  Result Value Ref Range   Total Volume 1,250 mL   Epinephrine, 24H, Ur see note mcg/24 h    Comment: Results are below the reportable range for this analyte, which is 2.0 mcg/L. Marland Kitchen This test was developed and its analytical performance characteristics have been determined by [place], [place]. It has not been cleared or approved by the U.S. Food and Drug Administration. This assay has been validated pursuant to the CLIA regulations and is used for clinical purposes. .    Norepinephrine, 24H, Ur 32 15 - 100 mcg/24 h    Comment: . This test was developed and its analytical performance characteristics have been determined by [place],  [place]. It has not been cleared or approved by the U.S. Food and Drug Administration. This assay has been validated pursuant to the CLIA regulations and is used for clinical purposes. Tamela Oddi Total (E+NE) 32 26 - 121 mcg/24 h  Comment: . This test was developed and its analytical performance characteristics have been determined by [facility] [place], [place]. It has not been cleared or approved by the U.S. Food and Drug Administration. This assay has been validated pursuant to the CLIA regulations and is used for clinical purposes. .    Dopamine 24 Hr Urine 181 52 - 480 mcg/24 h    Comment: . This test was developed and its analytical performance characteristics have been determined by [facility] [place], [place]. It has not been cleared or approved by the U.S. Food and Drug Administration. This assay has been validated pursuant to the CLIA regulations and is used for clinical purposes. .    Creatinine, Urine mg/day-CATEUR 1.03 0.50 - 2.15 g/24 h     Assessment & Plan:   1. Hyperhidrosis  - she has nonspecific hyperhidrosis. -Her extensive endocrine work-up is negative for pheochromocytoma, carcinoid's, thyroid dysfunctions. -It is unlikely that she has endocrine dysfunction to cause hyperhidrosis at this time. One possible etiology is her new medication, Lexapro, which is known to cause sweating as a side effect, increasing serotonin levels in the brain.  A change of this prescription may be considered, advised to discuss it with her prescriber. - I did not initiate any new prescriptions today. - she is advised to maintain close follow up with Erasmo Downer, NP for primary care needs.      - Time spent on this patient care encounter:  20 minutes of which 50% was spent in  counseling and the rest reviewing  her current and  previous labs / studies and medications  doses and developing a plan for [facility]. Jonette Eva Dattilo   participated in the discussions, expressed understanding, and voiced agreement with the above plans.  All questions were answered to her satisfaction. she is encouraged to contact clinic should she have any questions or concerns prior to her return visit.   Follow up plan: Return if symptoms worsen or fail to improve.   Marquis Lunch, MD [facility] [address] [place], [place] [address] Phone: [phone]  Fax: [phone]     03/26/2019, 2:37 PM  This note was partially dictated with voice recognition software. Similar sounding words can be transcribed inadequately or may not  be corrected upon review.

## 2019-05-27 ENCOUNTER — Telehealth: Payer: Self-pay | Admitting: Podiatry

## 2019-05-27 NOTE — Telephone Encounter (Signed)
Pt mother called stating that pt is having a hard time from a prev nail removal back in December now the toe is red and oozing pt has appt next Wednesday but is there anything pt can do until next week please advise

## 2019-06-03 ENCOUNTER — Ambulatory Visit (INDEPENDENT_AMBULATORY_CARE_PROVIDER_SITE_OTHER): Payer: Medicaid Other | Admitting: Podiatry

## 2019-06-03 ENCOUNTER — Other Ambulatory Visit: Payer: Self-pay

## 2019-06-03 ENCOUNTER — Encounter: Payer: Self-pay | Admitting: Podiatry

## 2019-06-03 DIAGNOSIS — L600 Ingrowing nail: Secondary | ICD-10-CM

## 2019-06-03 DIAGNOSIS — L309 Dermatitis, unspecified: Secondary | ICD-10-CM

## 2019-06-03 MED ORDER — BETAMETHASONE DIPROPIONATE 0.05 % EX CREA: TOPICAL_CREAM | Freq: Two times a day (BID) | CUTANEOUS | 1 refills | Status: DC

## 2019-06-03 MED ORDER — FLUOCINONIDE 0.05 % EX OINT: 1.0000 "application " | TOPICAL_OINTMENT | Freq: Two times a day (BID) | CUTANEOUS | 1 refills | Status: DC

## 2019-06-03 MED ORDER — GENTAMICIN SULFATE 0.1 % EX CREA: 1.0000 "application " | TOPICAL_CREAM | Freq: Two times a day (BID) | CUTANEOUS | 1 refills | Status: DC

## 2019-06-07 NOTE — Progress Notes (Signed)
HPI: 24 y.o. female presenting today for evaluation of a possible ingrown to the left toe.  Patient had an ingrown toenail avulsion procedure performed to the bilateral great toes on 11/03/2018.  Patient states that the right toenail is doing very well however she has noticed some swelling and redness with oozing to the left nail plate.  She has been soaking her foot in Epsom salt and applying the gentamicin cream.   She also states that she does get some inflammatory blister lesions to the plantar arch of the left foot.  As well as the first interspace.  These are very itchy and purulent and she has been applying fluocinonide ointment and she states that that is helped significantly.  She presents for further treatment evaluation  Past Medical History:  Diagnosis Date  . Chronic otitis media 12/2013  . Mild intellectual disability   . Nasal congestion 12/18/2013  . Recurrent dislocation of knee    bilateral     Physical Exam: General: The patient is alert and oriented x3 in no acute distress.  Dermatology: Skin is warm, dry and supple bilateral lower extremities. Negative for open lesions or macerations.  Inflammatory border noted to the left hallux nail plate lateral border.  The nail is incurvated with inflammatory reaction consistent with an ingrown toenail/paronychia.  Inflammatory lesions also noted to the first interspace of the left foot as well as the plantar arch.  These do not appear to be fungal in nature.  Clear pinpoint vesicles noted to the lesions.  Pruritus also noted  Vascular: Palpable pedal pulses bilaterally. No edema or erythema noted. Capillary refill within normal limits.  Neurological: Epicritic and protective threshold grossly intact bilaterally.   Musculoskeletal Exam: Range of motion within normal limits to all pedal and ankle joints bilateral. Muscle strength 5/5 in all groups bilateral.   Assessment: 1.  Recurrent ingrown toenail left great toe lateral  border 2.  Dermatitis left plantar foot and first interspace   Plan of Care:  1. Patient evaluated.  2.  I explained to the patient that we need to redo the nail avulsion to the lateral border of the left great toe.  Patient understands and agrees. Discussed treatment alternatives and plan of care. Explained nail avulsion procedure and post procedure course to patient. 3. Patient opted for permanent partial nail avulsion of the lateral border left great toe.  4. Prior to procedure, local anesthesia infiltration utilized using 3 ml of a 50:50 mixture of 2% plain lidocaine and 0.5% plain marcaine in a normal hallux block fashion and a betadine prep performed.  5. Partial permanent nail avulsion with chemical matrixectomy performed using 3x30sec applications of phenol followed by alcohol flush.  6.  New prescription for gentamicin cream  7.  Refill prescription for fluocinonide ointment provided for the patient  8.  Return to clinic in 2 weeks    Felecia Shelling, DPM [facility]  Dr. Felecia Shelling, DPM    [address]                                        [place], [place] [address]                Office [phone]  Fax [phone]          Felecia Shelling, DPM [facility]  Dr. Edrick Kins, DPM    [address], [place] [address]                Office [phone]  Fax [phone]

## 2019-06-11 ENCOUNTER — Other Ambulatory Visit: Payer: Self-pay | Admitting: Podiatry

## 2019-06-11 ENCOUNTER — Telehealth: Payer: Self-pay | Admitting: *Deleted

## 2019-06-11 MED ORDER — DOXYCYCLINE HYCLATE 100 MG PO TABS: 100.0000 mg | ORAL_TABLET | Freq: Two times a day (BID) | ORAL | 0 refills | Status: DC

## 2019-06-11 NOTE — Telephone Encounter (Signed)
I informed pt's mtr, Cala Bradford the antibiotic had been called to the [facility].

## 2019-06-11 NOTE — Progress Notes (Signed)
PRN postop ingrown toenail

## 2019-06-11 NOTE — Telephone Encounter (Signed)
I went ahead and called in Doxycycline 100mg  #20. Thanks, Dr.Edy Belt

## 2019-06-11 NOTE — Telephone Encounter (Signed)
Pt's mtr, states pt's toe may be infected after the toenail procedure, red and swollen.

## 2019-06-11 NOTE — Telephone Encounter (Signed)
Pt's mtr, Cala Bradford states from the base of the toenail to the 1st knuckle is swollen and red, and red down in to the toe. I instructed Cala Bradford to switch soaks to betadine 1 tablespoon in 1 quart of water twice daily and continue the gentamicin cream, until seen in office 06/24/2019 and I would inform Dr. Logan Bores and call with other orders. Cala Bradford states they will be on vacation 06/24/2019 and I transferred to scheduler to get an appt next week.

## 2019-06-18 ENCOUNTER — Encounter: Payer: Self-pay | Admitting: Podiatry

## 2019-06-18 ENCOUNTER — Other Ambulatory Visit: Payer: Self-pay

## 2019-06-18 ENCOUNTER — Ambulatory Visit: Payer: Medicaid Other | Admitting: Podiatry

## 2019-06-18 DIAGNOSIS — L03032 Cellulitis of left toe: Secondary | ICD-10-CM | POA: Diagnosis not present

## 2019-06-18 MED ORDER — DOXYCYCLINE HYCLATE 100 MG PO TABS: 100.0000 mg | ORAL_TABLET | Freq: Two times a day (BID) | ORAL | 0 refills | Status: DC

## 2019-06-18 NOTE — Patient Instructions (Signed)
Soak Instructions    THE DAY AFTER THE PROCEDURE  Place 1/4 cup of epsom salts in a quart of warm tap water.  Submerge your foot or feet with outer bandage intact for the initial soak; this will allow the bandage to become moist and wet for easy lift off.  Once you remove your bandage, continue to soak in the solution for 20 minutes.  This soak should be done twice a day.  Next, remove your foot or feet from solution, blot dry the affected area and cover.  You may use a band aid large enough to cover the area or use gauze and tape.  Apply other medications to the area as directed by the doctor such as polysporin neosporin.  IF YOUR SKIN BECOMES IRRITATED WHILE USING THESE INSTRUCTIONS, IT IS OKAY TO SWITCH TO  WHITE VINEGAR AND WATER. Or you may use antibacterial soap and water to keep the toe clean  Monitor for any signs/symptoms of infection. Call the office immediately if any occur or go directly to the emergency room. Call with any questions/concerns.    [facility] Instructions-Post Nail Surgery  You have had your ingrown toenail and root treated with a chemical.  This chemical causes a burn that will drain and ooze like a blister.  This can drain for 6-8 weeks or longer.  It is important to keep this area clean, covered, and follow the soaking instructions dispensed at the time of your surgery.  This area will eventually dry and form a scab.  Once the scab forms you no longer need to soak or apply a dressing.  If at any time you experience an increase in pain, redness, swelling, or drainage, you should contact the office as soon as possible.  

## 2019-06-18 NOTE — Progress Notes (Signed)
Subjective:   Patient ID: Carolyn Benitez, female   DOB: 24 y.o.   MRN: [id number]   HPI Patient presents with mother stating this left big toe has been swollen on the side and I have been taking doxycycline and using gentamicin cream.  Still bothersome   ROS      Objective:  Physical Exam  2 weeks after having a ingrown toenail resection.  Patient states that there is been some redness in the area and it still bothersome and she is getting ready to go to the beach     Assessment:  Neurovascular status intact with patient noted to have redness on the lateral side of the left hallux nail where it border was reduced that is local with no spread proximal with no increased edema erythema drainage beyond the proximal nail fold     Plan:  Possibility for low-grade paronychia infection left that may have occurred with healing of the nailbed with the possibility that bacteria has gotten plugged into this area.  I went ahead today I infiltrated 60 mg like Marcaine mixture I cleaned out the lateral border I allowed and create a channel for drainage flushed the area did not note any pus or anything for me to culture advised on soaks and will continue with the doxycycline.  Gave strict instructions if worsening should occur to contact us immediately and if any systemic signs of infection were to occur to go straight to the emergency room.  Patient will see Dr. Logan Bores in the next 10 days and is encouraged to call with questions concerns

## 2019-06-24 ENCOUNTER — Ambulatory Visit: Payer: Medicaid Other | Admitting: Podiatry

## 2019-06-29 ENCOUNTER — Other Ambulatory Visit: Payer: Self-pay

## 2019-06-29 ENCOUNTER — Ambulatory Visit: Payer: Medicaid Other | Admitting: Podiatry

## 2019-06-29 ENCOUNTER — Ambulatory Visit (INDEPENDENT_AMBULATORY_CARE_PROVIDER_SITE_OTHER): Payer: Medicaid Other | Admitting: Podiatry

## 2019-06-29 DIAGNOSIS — L600 Ingrowing nail: Secondary | ICD-10-CM | POA: Diagnosis not present

## 2019-06-29 DIAGNOSIS — L03032 Cellulitis of left toe: Secondary | ICD-10-CM

## 2019-06-29 DIAGNOSIS — R600 Localized edema: Secondary | ICD-10-CM

## 2019-06-29 NOTE — Progress Notes (Signed)
   HPI: 24 y.o. female presenting today for follow-up evaluation regarding tenderness to a partial permanent nail avulsion procedure of the left great toe lateral border.  Date of procedure: 06/03/2019.  Patient states that her left great toe is still swelling and tender to touch.  She is concerned that the nail is not healing appropriately.  She has been soaking it in Epsom salt and applying the antibiotic cream and she still has tenderness.  She denies having any purulent drainage.  But she wants a more definitive solution and to be treated aggressively to get this to heal.  Patient was last seen in the office on 06/18/2019 by Dr. Charlsie Merles at which time cultures were taken and the patient has been prescribed doxycycline 100 mg on 06/11/2019.  She does not notice any significant improvement.  She presents for further treatment and evaluation  Past Medical History:  Diagnosis Date  . Chronic otitis media 12/2013  . Mild intellectual disability   . Nasal congestion 12/18/2013  . Recurrent dislocation of knee    bilateral     Physical Exam: General: The patient is alert and oriented x3 in no acute distress.  Dermatology: Skin is warm, dry and supple bilateral lower extremities. Negative for open lesions or macerations.  There are some mild localized erythema with tenderness to touch around the base of the entire nail plate left hallux extending laterally to the lateral nail fold border.  Negative for any significant drainage.  No malodor noted.  Vascular: Palpable pedal pulses bilaterally.  Mild edema and erythema noted to the base of the nail plate left hallux. Capillary refill within normal limits.  Neurological: Epicritic and protective threshold grossly intact bilaterally.   Musculoskeletal Exam: H/o recurrent ingrown and partial permanent nail avulsion lateral border left hallux  Assessment: 1.  Recurring ingrown/inflammation left hallux   Plan of Care:  1. Patient evaluated.  2.  Patient  was present today with her father and I explained to both of them that the more aggressive step would be to totally remove the nail plate and let a new nail grow in.  This will allow for drainage and clearing of any abscess which I do not believe is in the area but may help to resolve the patient's swelling.  The patient and the father both agree to have the nail plate removed.  Again there was no purulent drainage or abscess noted with avulsion of the nail plate. 3.  The toe was prepped in aseptic manner and 3 mL of 2% lidocaine plain was utilized in a digital block fashion.  The nail plate was removed in its entirety and cleansed with the light dressing applied. 4.  Post care instructions were provided 5.  Continue antibiotic gentamicin cream daily.  The patient has the antibiotic cream at home 6.  Return to clinic in 2 weeks      Felecia Shelling, DPM [facility]  Dr. Felecia Shelling, DPM    [address] [place], [place] [address]                Office [phone]  Fax [phone]

## 2019-07-03 ENCOUNTER — Ambulatory Visit (INDEPENDENT_AMBULATORY_CARE_PROVIDER_SITE_OTHER): Payer: Medicaid Other | Admitting: Podiatry

## 2019-07-03 ENCOUNTER — Encounter: Payer: Self-pay | Admitting: Podiatry

## 2019-07-03 ENCOUNTER — Ambulatory Visit (INDEPENDENT_AMBULATORY_CARE_PROVIDER_SITE_OTHER): Payer: Medicaid Other

## 2019-07-03 ENCOUNTER — Other Ambulatory Visit: Payer: Self-pay

## 2019-07-03 DIAGNOSIS — M1990 Unspecified osteoarthritis, unspecified site: Secondary | ICD-10-CM

## 2019-07-03 DIAGNOSIS — Z9889 Other specified postprocedural states: Secondary | ICD-10-CM

## 2019-07-03 DIAGNOSIS — L03031 Cellulitis of right toe: Secondary | ICD-10-CM

## 2019-07-03 DIAGNOSIS — L02611 Cutaneous abscess of right foot: Secondary | ICD-10-CM | POA: Diagnosis not present

## 2019-07-03 DIAGNOSIS — M7752 Other enthesopathy of left foot: Secondary | ICD-10-CM | POA: Diagnosis not present

## 2019-07-03 NOTE — Patient Instructions (Signed)

## 2019-07-08 ENCOUNTER — Telehealth: Payer: Self-pay | Admitting: Podiatry

## 2019-07-08 NOTE — Progress Notes (Signed)
Subjective:   Patient ID: Carolyn Benitez, female   DOB: 24 y.o.   MRN: [id number]   HPI Patient presents with quite a bit of irritative redness around digits right over left with an incurvated right hallux that is red around the medial corner.  It is localized and she presents with her grandmother today and quite a bit of emotional distress due to the chronic nature of this problem and previous ingrown toenail resection   ROS      Objective:  Physical Exam  Neurovascular status intact with patient found to have redness around the digits bilateral with incurvated right hallux medial border that is localized painful and history of blistering of the arch region bilateral     Assessment:  Possibility for psoriatic arthritis as a causative agent of condition with paronychia infection right hallux     Plan:  H&P reviewed condition and at this point I am sending for blood work to see whether or not possibility of psoriatic arthritis is present.  I then went ahead and infiltrated the right hallux 60 mg like Marcaine mixture sterile prep applied and carefully remove the border and clean the border out in order to reduce the discomfort she is in and advised on soaks.  Patient had sterile dressing applied and will be seen back by Dr. Logan Bores who has been following her and should have blood work complete to try to rule out any kind of systemic inflammation

## 2019-07-08 NOTE — Telephone Encounter (Signed)
Pt mother called and wanted to know if the pt was to get a new antibiotic from visit 07/03/19

## 2019-07-15 ENCOUNTER — Ambulatory Visit: Payer: Medicaid Other | Admitting: Podiatry

## 2019-07-15 ENCOUNTER — Other Ambulatory Visit: Payer: Self-pay

## 2019-07-15 ENCOUNTER — Encounter: Payer: Self-pay | Admitting: Podiatry

## 2019-07-15 DIAGNOSIS — M7752 Other enthesopathy of left foot: Secondary | ICD-10-CM

## 2019-07-15 DIAGNOSIS — L309 Dermatitis, unspecified: Secondary | ICD-10-CM

## 2019-07-15 MED ORDER — METHYLPREDNISOLONE 4 MG PO TBPK: ORAL_TABLET | ORAL | 0 refills | Status: DC

## 2019-07-15 MED ORDER — IBUPROFEN 600 MG PO TABS: 600.0000 mg | ORAL_TABLET | Freq: Three times a day (TID) | ORAL | 1 refills | Status: AC | PRN

## 2019-07-17 NOTE — Telephone Encounter (Signed)
Dr. Charlsie Merles:  Does patient get a new antibiotic from when they saw you on 07/03/19?  Thank you, Olegario Messier

## 2019-07-17 NOTE — Progress Notes (Signed)
HPI: 24 y.o. female presenting today for follow-up evaluation regarding toe pain to the left great toe.  Patient had partial permanent nail avulsion of the left hallux nail plate on 4/0/7680.  She continues to have some mild inflammation and tenderness to the IPJ of the hallux.  She was last seen in the office on 07/03/2019 and blood work was ordered at that time.  The patient's mother actually has the blood lab results with her today and she presents for further treatment and evaluation.  She continues to have some tenderness to the great toe.  Patient has also been on oral doxycycline last month which seem to have decreased the erythema around the area but she still has some sensitivity.  No new complaints at this time.  Past Medical History:  Diagnosis Date  . Chronic otitis media 12/2013  . Mild intellectual disability   . Nasal congestion 12/18/2013  . Recurrent dislocation of knee    bilateral     Physical Exam: General: The patient is alert and oriented x3 in no acute distress.  Dermatology: Skin is warm, dry and supple bilateral lower extremities. Negative for open lesions or macerations.  No open wounds noted.  The nail avulsion site has healed completely.  Vascular: Palpable pedal pulses bilaterally.  No erythema noted.  There is very minimal edema noted around the base of the nail plate/IPJ of the left hallux.  Capillary refill within normal limits.  Neurological: Epicritic and protective threshold grossly intact bilaterally.   Musculoskeletal Exam: Range of motion within normal limits to all pedal and ankle joints bilateral. Muscle strength 5/5 in all groups bilateral.  There continues to be some mild to moderate inflammatory capsulitis overlying the IPJ of the left hallux.  There is no drainage from the incision site and there appears to be no fluctuance or palpable abscess to the area.  There is sensitivity to palpation however.  Assessment: 1.  Inflammation/inflammatory  capsulitis left great toe 2.  Possible abscess left hallux although this seems unlikely since there is little to no fluctuance, no drainage, and no erythema around the area.   Plan of Care:  1. Patient evaluated. X-Rays reviewed.  2. Patient's lab results were reviewed today.  All lab results were within normal limits with exception of the CRP which was 0.77 mg/dl. ESR 84m/hr, rheumatoid factor <10 IU/ML, uric acid 5.3 mg/dl, ANA negative.  [facility] [address], [place] [address]  The patient's mother left a copy of the lab results here with me today for review. 3.  Today we are going to treat the patient with anti-inflammatory to see if her symptoms improved.   4.  Prescription for Medrol Dosepak take as directed. 5.  Prescription for Motrin 600 mg 3 times daily to take after completion of the Dosepak 6.  Recommend good shoe gear that does not agitate or irritate the area 7.  Return to clinic in 3 weeks, if the patient is not better we may need to order additional blood work, comprehensive metabolic panel, and/or MRI left hallux      BEdrick Kins DPM [facility]  Dr. BEdrick Kins DPM    [address]  [place] [address]                Office [phone]  Fax [phone]

## 2019-07-20 ENCOUNTER — Ambulatory Visit: Payer: Medicaid Other | Admitting: Podiatry

## 2019-07-20 NOTE — Telephone Encounter (Signed)
Not at this point. I never saw her blood work

## 2019-07-20 NOTE — Telephone Encounter (Signed)
Let message informing pt's mtr, Selena Batten that the prescriptions from 07/15/2019 Dr. Logan Bores had been called to the [facility] in [place], [place] at 2:14pm and she could contact me if

## 2019-08-05 ENCOUNTER — Ambulatory Visit (INDEPENDENT_AMBULATORY_CARE_PROVIDER_SITE_OTHER): Payer: Medicaid Other | Admitting: Podiatry

## 2019-08-05 ENCOUNTER — Other Ambulatory Visit: Payer: Self-pay

## 2019-08-05 DIAGNOSIS — L03032 Cellulitis of left toe: Secondary | ICD-10-CM

## 2019-08-05 DIAGNOSIS — M7752 Other enthesopathy of left foot: Secondary | ICD-10-CM | POA: Diagnosis not present

## 2019-08-05 NOTE — Progress Notes (Signed)
   HPI: 24 y.o. female presenting today for follow-up evaluation regarding toe pain to the left great toe.  Patient had partial permanent nail avulsion of the left hallux nail plate on 03/03/6710.  She continues to have some mild inflammation and tenderness to the IPJ of the hallux.  Last visit on 07/15/2019 anti-inflammatory Medrol Dosepak as well as Motrin 600 mg was provided for the patient.  She completed the Dosepak and has been taking the Motrin 600 mg 3 times daily with minimal relief.  She continues to have pain and sensitivity to the left hallux.  She has now been dealing with this pain for most of this year.  Greater than 6 months.  She presents today with her mother for further treatment and evaluation  Past Medical History:  Diagnosis Date  . Chronic otitis media 12/2013  . Mild intellectual disability   . Nasal congestion 12/18/2013  . Recurrent dislocation of knee    bilateral     Physical Exam: General: The patient is alert and oriented x3 in no acute distress.  Dermatology: Skin is warm, dry and supple bilateral lower extremities. Negative for open lesions or macerations.  No open wounds noted.  The nail avulsion site has healed completely.  Loss of nail noted however to the left hallux nail plate  Vascular: Palpable pedal pulses bilaterally.  No erythema noted.  There is very minimal edema noted around the base of the nail plate/IPJ of the left hallux that appears somewhat chronic in nature but is very sensitive to palpation.  Capillary refill within normal limits.  Neurological: Epicritic and protective threshold grossly intact bilaterally.   Musculoskeletal Exam: Range of motion within normal limits to all pedal and ankle joints bilateral. Muscle strength 5/5 in all groups bilateral.  There continues to be some mild to moderate inflammatory capsulitis overlying the IPJ of the left hallux.  There is no drainage from the incision site and there appears to be no fluctuance or  palpable abscess to the area.  There is sensitivity to palpation however.  Assessment: 1.  Inflammation/inflammatory capsulitis left great toe 2.  Possible abscess left hallux although this seems unlikely since there is little to no fluctuance, no drainage, and no erythema around the area.   Plan of Care:  1. Patient evaluated.  2.  The patient has now failed conservative treatment has been ongoing for greater than 6 months now.  She has had little to no relief despite multiple conservative modalities. 3.  Today we will order MRI left hallux without contrast.  Order will be placed at [facility], patient's mother works there 4.  Continue Motrin as needed 5.  Return to clinic after MRI to review results 6.  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 Felecia Shelling, DPM    [address] [place], [place] [address]                Office [phone]  Fax [phone]

## 2019-08-06 ENCOUNTER — Telehealth: Payer: Self-pay | Admitting: *Deleted

## 2019-08-06 ENCOUNTER — Telehealth: Payer: Self-pay | Admitting: Podiatry

## 2019-08-06 DIAGNOSIS — M7752 Other enthesopathy of left foot: Secondary | ICD-10-CM

## 2019-08-06 DIAGNOSIS — M1990 Unspecified osteoarthritis, unspecified site: Secondary | ICD-10-CM

## 2019-08-06 DIAGNOSIS — R600 Localized edema: Secondary | ICD-10-CM

## 2019-08-06 NOTE — Telephone Encounter (Addendum)
Unable to connect fax to [phone], called [facility] and was given the alternate back fax line [phone]. I was unable to connect [phone].

## 2019-08-06 NOTE — Telephone Encounter (Signed)
I spoke with [employer] - Medicaid-Kendra B. states pt continues to be served by [employer] and [place] in no longer required as of 07/02/2019, Reference: [id number]. Faxed to [facility].

## 2019-08-06 NOTE — Telephone Encounter (Signed)
Faxed MRI left foot [phone] to [facility].

## 2019-08-06 NOTE — Telephone Encounter (Signed)
I spoke with [facility] and informed the fax machines would not connect and asked if she had an email. Kim states email to [email or web address]. Selena Batten states she wil contact me if it did not come through.

## 2019-08-06 NOTE — Telephone Encounter (Signed)
08/06/2019 8:42am phone message - Pts mother called and would like MRI order for [facility] to be sent to the mother's place of work, [employer] in [place], [place], instead of [employer].

## 2019-08-06 NOTE — Telephone Encounter (Signed)
-----   Message from Felecia Shelling, [place] sent at 08/05/2019  7:28 PM EDT ----- Regarding: MRI left hallux Please order MRI left hallux without contrast  Diagnosis: Chronic inflammatory pain/capsulitis left hallux greater than 6 months  Thanks, Dr. Logan Bores   #Note dictated :-)

## 2019-08-06 NOTE — Telephone Encounter (Signed)
Pts mother called and would like MRI order for Carolyn Benitez to be sent to the mother's place of work, [employer] in [place], [place], instead of [employer].

## 2019-08-24 ENCOUNTER — Encounter (HOSPITAL_COMMUNITY): Payer: Self-pay | Admitting: Emergency Medicine

## 2019-08-24 ENCOUNTER — Other Ambulatory Visit: Payer: Self-pay

## 2019-08-24 ENCOUNTER — Emergency Department (HOSPITAL_COMMUNITY)
Admission: EM | Admit: 2019-08-24 | Discharge: 2019-08-24 | Disposition: A | Discharge status: Home / Self Care | Payer: Medicaid Other | Attending: Emergency Medicine | Admitting: Emergency Medicine

## 2019-08-24 ENCOUNTER — Emergency Department (HOSPITAL_COMMUNITY): Payer: Medicaid Other

## 2019-08-24 DIAGNOSIS — Y939 Activity, unspecified: Secondary | ICD-10-CM | POA: Insufficient documentation

## 2019-08-24 DIAGNOSIS — Z79899 Other long term (current) drug therapy: Secondary | ICD-10-CM | POA: Diagnosis not present

## 2019-08-24 DIAGNOSIS — T7421XA Adult sexual abuse, confirmed, initial encounter: Secondary | ICD-10-CM | POA: Diagnosis present

## 2019-08-24 DIAGNOSIS — Y929 Unspecified place or not applicable: Secondary | ICD-10-CM | POA: Diagnosis not present

## 2019-08-24 DIAGNOSIS — K6289 Other specified diseases of anus and rectum: Secondary | ICD-10-CM | POA: Insufficient documentation

## 2019-08-24 LAB — BASIC METABOLIC PANEL
Anion gap: 6 (ref 5–15)
BUN: 11 mg/dL (ref 6–20)
CO2: 25 mmol/L (ref 22–32)
Calcium: 8.8 mg/dL — ABNORMAL LOW (ref 8.9–10.3)
Chloride: 107 mmol/L (ref 98–111)
Creatinine, Ser: 0.59 mg/dL (ref 0.44–1.00)
GFR calc Af Amer: >60 mL/min (ref >60)
GFR calc non Af Amer: >60 mL/min (ref >60)
Glucose, Bld: 119 mg/dL — ABNORMAL HIGH (ref 70–99)
Potassium: 3.8 mmol/L (ref 3.5–5.1)
Sodium: 138 mmol/L (ref 135–145)

## 2019-08-24 LAB — CBC
HCT: 35.9 % — ABNORMAL LOW (ref 36.0–46.0)
Hemoglobin: 11.9 g/dL — ABNORMAL LOW (ref 12.0–15.0)
MCH: 29.9 pg (ref 26.0–34.0)
MCHC: 33.1 g/dL (ref 30.0–36.0)
MCV: 90.2 fL (ref 80.0–100.0)
Platelets: 260 10*3/uL (ref 150–400)
RBC: 3.98 MIL/uL (ref 3.87–5.11)
RDW: 13.8 % (ref 11.5–15.5)
WBC: 5.3 10*3/uL (ref 4.0–10.5)
nRBC: 0 % (ref 0.0–0.2)

## 2019-08-24 LAB — I-STAT BETA HCG BLOOD, ED (MC, WL, AP ONLY): I-stat hCG, quantitative: <5 m[IU]/mL (ref <5)

## 2019-08-24 MED ORDER — IOHEXOL 300 MG/ML  SOLN
100.0000 mL | Freq: Once | INTRAMUSCULAR | Status: AC | PRN
  Administered 2019-08-24: 100 mL via INTRAVENOUS
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
## 2019-10-14 ENCOUNTER — Ambulatory Visit: Payer: Medicaid Other | Admitting: Podiatry

## 2019-10-26 ENCOUNTER — Ambulatory Visit (INDEPENDENT_AMBULATORY_CARE_PROVIDER_SITE_OTHER): Payer: Medicaid Other | Admitting: Podiatry

## 2019-10-26 ENCOUNTER — Other Ambulatory Visit: Payer: Self-pay

## 2019-10-26 DIAGNOSIS — R600 Localized edema: Secondary | ICD-10-CM | POA: Diagnosis not present

## 2019-10-26 DIAGNOSIS — L03031 Cellulitis of right toe: Secondary | ICD-10-CM | POA: Diagnosis not present

## 2019-10-26 DIAGNOSIS — L600 Ingrowing nail: Secondary | ICD-10-CM | POA: Diagnosis not present

## 2019-10-26 DIAGNOSIS — M7752 Other enthesopathy of left foot: Secondary | ICD-10-CM

## 2019-10-26 NOTE — Progress Notes (Signed)
   HPI: 24 y.o. female presenting today for follow-up evaluation regarding toe pain to the left great toe.  Patient had partial permanent nail avulsion of the left hallux nail plate on 08/05/4625.  Patient states that since last visit her pain has resolved.  She also received MRI on 08/17/2019.  She presents today to review the MRI results and discuss any additional treatment options although the pain is completely subsided.  Past Medical History:  Diagnosis Date  . Chronic otitis media 12/2013  . Mild intellectual disability   . Nasal congestion 12/18/2013  . Recurrent dislocation of knee    bilateral     Physical Exam: General: The patient is alert and oriented x3 in no acute distress.  Dermatology: Skin is warm, dry and supple bilateral lower extremities. Negative for open lesions or macerations.  No open wounds noted.  The nail avulsion site has healed completely.  Loss of nail noted however to the left hallux nail plate  Vascular: Palpable pedal pulses bilaterally.  No erythema noted.  There is very minimal edema noted around the base of the nail plate/IPJ of the left hallux that appears somewhat chronic in nature but is very sensitive to palpation.  Capillary refill within normal limits.  Neurological: Epicritic and protective threshold grossly intact bilaterally.   Musculoskeletal Exam: Range of motion within normal limits to all pedal and ankle joints bilateral. Muscle strength 5/5 in all groups bilateral.  Negative for any pain on palpation throughout the toe joint or nail plate   MRI impression 08/17/2019: Evaluation of osseous structures demonstrates no evidence of marrow edema nor critical erosion.  There is no appreciable loculated fluid collections nor significant free fluid within the surrounding tissue.  There is a mild increased T2 signal within the soft tissues of the great toe which may represent inflammatory changes  No MR evidence of osteomyelitis nor abscess involving the  great toe left foot.  Assessment: 1.  Inflammation/inflammatory capsulitis left great toe-resolved  Plan of Care:  1. Patient evaluated.  2.  Patient may resume full activity no restrictions 3.  Recommend good supportive shoes 4.  Return to clinic as needed   Felecia Shelling, DPM [facility]  Dr. Felecia Shelling, DPM    [address] [place], [place] [address]                Office [phone]  Fax [phone]

## 2019-12-07 ENCOUNTER — Ambulatory Visit (INDEPENDENT_AMBULATORY_CARE_PROVIDER_SITE_OTHER): Payer: Medicaid Other | Admitting: Podiatry

## 2019-12-07 ENCOUNTER — Other Ambulatory Visit: Payer: Self-pay

## 2019-12-07 DIAGNOSIS — L600 Ingrowing nail: Secondary | ICD-10-CM

## 2019-12-07 MED ORDER — DOXYCYCLINE HYCLATE 100 MG PO TABS: 100.0000 mg | ORAL_TABLET | Freq: Two times a day (BID) | ORAL | 0 refills | Status: DC

## 2019-12-07 NOTE — Progress Notes (Signed)
   Subjective: Patient presents today for evaluation of pain to the medial border left great toe and the medial border of the right third toe. Patient is concerned for possible ingrown nail. Patient presents today for further treatment and evaluation.  Past Medical History:  Diagnosis Date  . Chronic otitis media 12/2013  . Mild intellectual disability   . Nasal congestion 12/18/2013  . Recurrent dislocation of knee    bilateral    Objective:  General: Well developed, nourished, in no acute distress, alert and oriented x3   Dermatology: Skin is warm, dry and supple bilateral.  Left hallux and right third toe appears to be erythematous with evidence of an ingrowing nail. Pain on palpation noted to the border of the nail fold. The remaining nails appear unremarkable at this time. There are no open sores, lesions.  Vascular: Dorsalis Pedis artery and Posterior Tibial artery pedal pulses palpable. No lower extremity edema noted.   Neruologic: Grossly intact via light touch bilateral.  Musculoskeletal: Muscular strength within normal limits in all groups bilateral. Normal range of motion noted to all pedal and ankle joints.   Assesement: #1 Paronychia with ingrowing nail medial border left great toe.  Medial border right third toe #2 Pain in toe #3 Incurvated nail #4 H/o partial nail matricectomy lateral border bilateral great toes.  Plan of Care:  1. Patient evaluated.  2.  The patient is very frustrated because she keeps getting recurrent ingrown toenails to different digits.  I explained to the patient that I cannot fully explain why she is getting ingrown toenails.  Recommend that she trims her nails appropriately and does not wear shoes that are too tight or constrict the toebox area. 3.  Prescription for doxycycline 100 mg 2 times daily #20 4.  Return to clinic in 1 week, if there is no improvement with antibiotics we will proceed with partial nail matricectomy of the respective  toes  Felecia Shelling, DPM [facility]  Dr. Felecia Shelling, DPM    [address]                                        [place], [place] [address]                Office [phone]  Fax [phone]

## 2019-12-16 ENCOUNTER — Other Ambulatory Visit: Payer: Self-pay

## 2019-12-16 ENCOUNTER — Ambulatory Visit: Payer: Medicaid Other | Admitting: Podiatry

## 2019-12-16 DIAGNOSIS — L600 Ingrowing nail: Secondary | ICD-10-CM

## 2019-12-16 MED ORDER — GENTAMICIN SULFATE 0.1 % EX CREA
1.0000 | TOPICAL_CREAM | Freq: Two times a day (BID) | CUTANEOUS | 1 refills | Status: DC
Start: 2019-12-16 — End: 2020-06-13

## 2020-01-03 NOTE — Progress Notes (Signed)
   Subjective: Patient presents today for evaluation of pain and possible ingrown toenail to the lateral border right second toe, medial border right third toe, medial border left great toe. Patient is concerned for possible ingrown nail.  Patient has a history of multiple ingrown toenails in the past.  She is very frustrated today because she continues to get recurrent ingrown's.  The areas that we have performed partial permanent nail avulsions have been satisfactory to alleviate ingrown.  Patient presents today for further treatment and evaluation.  Past Medical History:  Diagnosis Date  . Chronic otitis media 12/2013  . Mild intellectual disability   . Nasal congestion 12/18/2013  . Recurrent dislocation of knee    bilateral    Objective:  General: Well developed, nourished, in no acute distress, alert and oriented x3   Dermatology: Skin is warm, dry and supple bilateral.  Lateral border right second toe, medial border right third toe, medial border left great toe appears to be erythematous with evidence of an ingrowing nail. Pain on palpation noted to the border of the nail fold. The remaining nails appear unremarkable at this time. There are no open sores, lesions.  Vascular: Dorsalis Pedis artery and Posterior Tibial artery pedal pulses palpable. No lower extremity edema noted.   Neruologic: Grossly intact via light touch bilateral.  Musculoskeletal: Muscular strength within normal limits in all groups bilateral. Normal range of motion noted to all pedal and ankle joints.   Assesement: #1 Paronychia with ingrowing nail  RT 2nd lateral, RT 3rd medial, LT hallux medial #2 H/o bilateral partial nail matricectomies lateral border B/L halluces  Plan of Care:  1. Patient evaluated.  2.  Today we are going to pursue conservative treatment.  Recommend gentamicin cream and massage daily to the ingrown areas. 3.  If the patient is not improved in 4 weeks we may need to proceed with  partial nail matricectomy's 4.  Return to clinic in 4 weeks  Felecia Shelling, DPM [facility]  Dr. Felecia Shelling, DPM    [address] [place], [place] [address]                Office [phone]  Fax [phone]

## 2020-01-13 ENCOUNTER — Ambulatory Visit: Payer: Medicaid Other | Admitting: Podiatry

## 2020-06-07 ENCOUNTER — Ambulatory Visit: Payer: Medicaid Other | Admitting: Podiatry

## 2020-06-13 ENCOUNTER — Ambulatory Visit: Payer: Medicaid Other | Admitting: Podiatry

## 2020-06-13 ENCOUNTER — Other Ambulatory Visit: Payer: Self-pay

## 2020-06-13 ENCOUNTER — Encounter: Payer: Self-pay | Admitting: Podiatry

## 2020-06-13 DIAGNOSIS — L600 Ingrowing nail: Secondary | ICD-10-CM

## 2020-06-13 MED ORDER — GENTAMICIN SULFATE 0.1 % EX CREA
1.0000 | TOPICAL_CREAM | Freq: Two times a day (BID) | CUTANEOUS | 1 refills | Status: DC
Start: 2020-06-13 — End: 2020-11-04

## 2020-06-15 ENCOUNTER — Telehealth: Payer: Self-pay | Admitting: *Deleted

## 2020-06-15 NOTE — Telephone Encounter (Signed)
Patient is calling because she is in a lot of painful even w/ walking,swollen,red toe,  can not bend.  She has been taking ibuprofen every 6 hrs,applying the cream(Gentamicin),soaking as instructed. Please advise.

## 2020-06-15 NOTE — Telephone Encounter (Signed)
Please scheduled asap

## 2020-06-16 ENCOUNTER — Encounter: Payer: Self-pay | Admitting: Podiatry

## 2020-06-16 ENCOUNTER — Other Ambulatory Visit: Payer: Self-pay

## 2020-06-16 ENCOUNTER — Ambulatory Visit (INDEPENDENT_AMBULATORY_CARE_PROVIDER_SITE_OTHER): Payer: Medicaid Other | Admitting: Podiatry

## 2020-06-16 VITALS — BP 126/84 | HR 96 | Temp 100.2°F | Resp 16

## 2020-06-16 DIAGNOSIS — L03032 Cellulitis of left toe: Secondary | ICD-10-CM | POA: Diagnosis not present

## 2020-06-16 DIAGNOSIS — L600 Ingrowing nail: Secondary | ICD-10-CM

## 2020-06-16 DIAGNOSIS — S83006A Unspecified dislocation of unspecified patella, initial encounter: Secondary | ICD-10-CM | POA: Insufficient documentation

## 2020-06-16 MED ORDER — DOXYCYCLINE HYCLATE 100 MG PO TABS: 100.0000 mg | ORAL_TABLET | Freq: Two times a day (BID) | ORAL | 0 refills | Status: DC

## 2020-06-16 NOTE — Progress Notes (Signed)
Subjective:  Patient ID: Carolyn Benitez, female    DOB: October 23, 1995,  MRN: [id number]  Chief Complaint  Patient presents with   Post-op Problem    "It's not doing good.  I took the bandage off the day after like he said.  It was red across the top of my foot.  I've been soaking them.  It feels like the pain gets worse when I have them wrapped."  (Patient presents with out bandages.)    25 y.o. female presents with the above complaint.  Patient presents with redness to the left fourth digit ingrown nail status post partial ingrown nail avulsion.  She had an ingrown removed by Dr. Logan Bores.  She states it started getting red and swollen and painful over the last few weeks has progressive gotten worse.  She is not on any antibiotics she has not seen anyone else prior to seeing me for this.  She denies any other acute complaints.   Review of Systems: Negative except as noted in the HPI. Denies N/V/F/Ch.  Past Medical History:  Diagnosis Date   Chronic otitis media 12/2013   Mild intellectual disability    Nasal congestion 12/18/2013   Recurrent dislocation of knee    bilateral    Current Outpatient Medications:    doxycycline (VIBRA-TABS) 100 MG tablet, Take 1 tablet (100 mg total) by mouth 2 (two) times daily., Disp: 28 tablet, Rfl: 0   levonorgestrel-ethinyl estradiol (NORDETTE) 0.15-30 MG-MCG tablet, Take 1 tablet by mouth daily., Disp: , Rfl:    cetirizine (ZYRTEC) 10 MG tablet, Take by mouth., Disp: , Rfl:    doxycycline (VIBRA-TABS) 100 MG tablet, Take 1 tablet (100 mg total) by mouth 2 (two) times daily., Disp: 20 tablet, Rfl: 0   escitalopram (LEXAPRO) 20 MG tablet, Take 20 mg by mouth daily., Disp: , Rfl:    fluocinonide ointment (LIDEX) 0.05 %, Apply 1 application topically 2 (two) times daily., Disp: 30 g, Rfl: 1   gentamicin cream (GARAMYCIN) 0.1 %, Apply 1 application topically 2 (two) times daily., Disp: 15 g, Rfl: 1   hydrOXYzine (ATARAX/VISTARIL) 25 MG tablet, Take 25 mg by mouth  every 8 (eight) hours as needed., Disp: , Rfl:    ibuprofen (ADVIL) 600 MG tablet, Take 1 tablet (600 mg total) by mouth every 8 (eight) hours as needed., Disp: 60 tablet, Rfl: 1   levonorgestrel-ethinyl estradiol (ALESSE) 0.1-20 MG-MCG tablet, Take by mouth., Disp: , Rfl:    loratadine (CLARITIN) 10 MG tablet, 1 tablet, Disp: , Rfl:    methylPREDNISolone (MEDROL DOSEPAK) 4 MG TBPK tablet, 6 day dose pack - take as directed, Disp: 21 tablet, Rfl: 0   mirtazapine (REMERON) 7.5 MG tablet, Take 0.5 tablets by mouth at bedtime., Disp: , Rfl:    sertraline (ZOLOFT) 100 MG tablet, Take 1 tablet by mouth daily., Disp: , Rfl:   Social History   Tobacco Use  Smoking Status Never  Smokeless Tobacco Never    No Known Allergies Objective:   Vitals:   06/16/20 1514  BP: 126/84  Pulse: 96  Resp: 16  Temp: 100.2 F (37.9 C)   There is no height or weight on file to calculate BMI. Constitutional Well developed. Well nourished.  Vascular Dorsalis pedis pulses palpable bilaterally. Posterior tibial pulses palpable bilaterally. Capillary refill normal to all digits.  No cyanosis or clubbing noted. Pedal hair growth normal.  Neurologic Normal speech. Oriented to person, place, and time. Epicritic sensation to light touch grossly present bilaterally.  Dermatologic Paronychia/erythema  noted circumferential around the left fourth digit.  Pain on palpation.  Mild swelling noted to the fourth digit.  No cellulitis noted.  No purulent drainage expressed.  Orthopedic: Normal joint ROM without pain or crepitus bilaterally. No visible deformities. No bony tenderness.   Radiographs: None Assessment:   1. Paronychia of toe of left foot due to ingrown toenail    Plan:  Patient was evaluated and treated and all questions answered.  Left fourth digit paronychia with a history of ingrown removal -I explained to the patient the etiology of paronychia and various treatment options were extensively  discussed.  I believe she will benefit from doxycycline for 14-day course.  I have asked her to see Dr. Logan Bores after she has completed the course to be evaluated.  At this time I do not see a recurrence of ingrown happening.  There is no purulent drainage noted.  No wound formation. -Doxy was sent to the pharmacy -She will also be wearing open toed shoes and to resolve meant.  No follow-ups on file.

## 2020-06-20 NOTE — Progress Notes (Signed)
   Subjective: Patient presents today for evaluation of pain to the medial border of the right great toe as well as the medial border of the left fourth toe. Patient is concerned for possible ingrown nail.  It is very sensitive to touch.  She does have a history of partial nail matricectomy's and ingrown toenails to the bilateral feet.  Patient presents today for further treatment and evaluation.  Past Medical History:  Diagnosis Date   Chronic otitis media 12/2013   Mild intellectual disability    Nasal congestion 12/18/2013   Recurrent dislocation of knee    bilateral    Objective:  General: Well developed, nourished, in no acute distress, alert and oriented x3   Dermatology: Skin is warm, dry and supple bilateral.  Medial border right great toe and medial border left fourth toe appears to be erythematous with evidence of an ingrowing nail. Pain on palpation noted to the border of the nail fold. The remaining nails appear unremarkable at this time. There are no open sores, lesions.  Vascular: Dorsalis Pedis artery and Posterior Tibial artery pedal pulses palpable. No lower extremity edema noted.   Neruologic: Grossly intact via light touch bilateral.  Musculoskeletal: Muscular strength within normal limits in all groups bilateral. Normal range of motion noted to all pedal and ankle joints.   Assesement: #1 Paronychia with ingrowing nail med border RT hallux. Med border LT 4th toe #2 H/o bilateral partial nail matricectomy's lateral border B/L great toes  Plan of Care:  1. Patient evaluated.  2. Discussed treatment alternatives and plan of care. Explained nail avulsion procedure and post procedure course to patient. 3. Patient opted for permanent partial nail avulsion of the medial border right great toe medial border left fourth toe.  4. Prior to procedure, local anesthesia infiltration utilized using 3 ml of a 50:50 mixture of 2% plain lidocaine and 0.5% plain marcaine in a normal  hallux block fashion and a betadine prep performed.  5. Partial permanent nail avulsion with chemical matrixectomy performed using 3x30sec applications of phenol followed by alcohol flush.  6. Light dressing applied.  Post care instructions provided 7.  Prescription for gentamicin 2% cream  8.  Return to clinic 2 weeks.  Felecia Shelling, DPM [facility]  Dr. Felecia Shelling, DPM    [address] [place], [place] [address]                Office [phone]  Fax [phone]

## 2020-06-22 ENCOUNTER — Ambulatory Visit: Payer: Medicaid Other | Admitting: Podiatry

## 2020-06-28 ENCOUNTER — Ambulatory Visit: Payer: Medicaid Other | Admitting: Podiatry

## 2020-07-06 ENCOUNTER — Ambulatory Visit: Payer: Medicaid Other | Admitting: Podiatry

## 2020-07-06 ENCOUNTER — Other Ambulatory Visit: Payer: Self-pay

## 2020-07-06 DIAGNOSIS — L600 Ingrowing nail: Secondary | ICD-10-CM

## 2020-07-06 NOTE — Progress Notes (Signed)
   Subjective: 25 y.o. female presents today status post permanent nail avulsion procedure of the medial border right great toe medial border left fourth that was performed on 06/13/2020.  Patient states that she is feeling much better.  She is doing well with no pain or sensitivity associated to the ingrown toenail.  Past Medical History:  Diagnosis Date   Chronic otitis media 12/2013   Mild intellectual disability    Nasal congestion 12/18/2013   Recurrent dislocation of knee    bilateral    Objective: Skin is warm, dry and supple. Nail and respective nail fold appears to be healing appropriately. Open wound to the associated nail fold with a granular wound base and moderate amount of fibrotic tissue. Minimal drainage noted. Mild erythema around the periungual region likely due to phenol chemical matricectomy.  Assessment: #1 s/p partial permanent nail matrixectomy medial border right great toe medial border left fourth toe   Plan of care: #1 patient was evaluated  #2 light debridement of open wound was performed to the periungual border of the respective toe using a currette. Antibiotic ointment and Band-Aid was applied. #3 patient is to return to clinic on a PRN basis.   Felecia Shelling, DPM [facility]  Dr. Felecia Shelling, DPM    [address] [place], [place] [address]                Office [phone]  Fax [phone]
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
## 2020-09-21 ENCOUNTER — Other Ambulatory Visit: Payer: Self-pay

## 2020-09-21 ENCOUNTER — Ambulatory Visit: Payer: Medicaid Other | Admitting: Podiatry

## 2020-09-21 DIAGNOSIS — L600 Ingrowing nail: Secondary | ICD-10-CM

## 2020-09-21 MED ORDER — DOXYCYCLINE HYCLATE 100 MG PO TABS: 100.0000 mg | ORAL_TABLET | Freq: Two times a day (BID) | ORAL | 0 refills | Status: AC

## 2020-09-21 NOTE — Progress Notes (Signed)
   Subjective: Patient presents today for evaluation of pain to the left second, right third and fourth toes.  Patient has history of multiple recurrent ingrown toenails to the bilateral feet.  Patient is concerned for possible ingrown nail.  It is very sensitive to touch.  Patient presents today for further treatment and evaluation.  Past Medical History:  Diagnosis Date   Chronic otitis media 12/2013   Mild intellectual disability    Nasal congestion 12/18/2013   Recurrent dislocation of knee    bilateral    Objective:  General: Well developed, nourished, in no acute distress, alert and oriented x3   Dermatology: Skin is warm, dry and supple bilateral.  Medial and lateral borders of the LT second, RT third and fourth appears to be erythematous with evidence of an ingrowing nail. Pain on palpation noted to the border of the nail fold. The remaining nails appear unremarkable at this time. There are no open sores, lesions.  Vascular: Dorsalis Pedis artery and Posterior Tibial artery pedal pulses palpable. No lower extremity edema noted.   Neruologic: Grossly intact via light touch bilateral.  Musculoskeletal: Muscular strength within normal limits in all groups bilateral. Normal range of motion noted to all pedal and ankle joints.   Assesement: #1 Paronychia with ingrowing nail RT 2nd, LT 3rd and 4th medial and lateral borders #2 Pain in toe  Plan of Care:  1. Patient evaluated.  2. Discussed treatment alternatives and plan of care. Explained nail avulsion procedure and post procedure course to patient. 3. Patient opted for permanent partial nail avulsion of the ingrown portion of the nail.  4. Prior to procedure, local anesthesia infiltration utilized using 3 ml of a 50:50 mixture of 2% plain lidocaine and 0.5% plain marcaine in a normal hallux block fashion and a betadine prep performed.  5. Partial permanent nail avulsion with chemical matrixectomy performed using 3x30sec  applications of phenol followed by alcohol flush.  6. Light dressing applied.  Post care instructions provided.  Patient has prescription gentamicin cream at home 7.  Prescription for doxycycline 100 mg 2 times daily #20 8.  Return to clinic 2 weeks.  Felecia Shelling, DPM [facility]  Dr. Felecia Shelling, DPM    [address] [place], [place] [address]                Office [phone]  Fax [phone]

## 2020-11-04 ENCOUNTER — Encounter (INDEPENDENT_AMBULATORY_CARE_PROVIDER_SITE_OTHER): Payer: Self-pay

## 2020-11-04 ENCOUNTER — Other Ambulatory Visit: Payer: Self-pay

## 2020-11-04 ENCOUNTER — Ambulatory Visit (INDEPENDENT_AMBULATORY_CARE_PROVIDER_SITE_OTHER): Payer: Medicaid Other | Admitting: Podiatry

## 2020-11-04 DIAGNOSIS — L600 Ingrowing nail: Secondary | ICD-10-CM

## 2020-11-04 MED ORDER — DOXYCYCLINE HYCLATE 100 MG PO TABS
100.0000 mg | ORAL_TABLET | Freq: Two times a day (BID) | ORAL | 0 refills | Status: DC
Start: 2020-11-04 — End: 2021-06-21

## 2020-11-04 MED ORDER — GENTAMICIN SULFATE 0.1 % EX CREA
1.0000 | TOPICAL_CREAM | Freq: Two times a day (BID) | CUTANEOUS | 1 refills | Status: DC
Start: 2020-11-04 — End: 2021-09-27

## 2020-11-04 NOTE — Progress Notes (Signed)
   Subjective: Patient PMHx multiple recurrent ingrown toenails presents today for evaluation of pain to the right second toe and left third toe. Patient is concerned for possible ingrown nail.  It is very sensitive to touch.  Patient presents today for further treatment and evaluation.  Past Medical History:  Diagnosis Date   Chronic otitis media 12/2013   Mild intellectual disability    Nasal congestion 12/18/2013   Recurrent dislocation of knee    bilateral    Objective:  General: Well developed, nourished, in no acute distress, alert and oriented x3   Dermatology: Skin is warm, dry and supple bilateral.  Medial and lateral border of the right second toe and the medial border of the left third toe appears to be erythematous with evidence of an ingrowing nail. Pain on palpation noted to the border of the nail fold. The remaining nails appear unremarkable at this time. There are no open sores, lesions.  Vascular: Dorsalis Pedis artery and Posterior Tibial artery pedal pulses palpable. No lower extremity edema noted.   Neruologic: Grossly intact via light touch bilateral.  Musculoskeletal: Muscular strength within normal limits in all groups bilateral. Normal range of motion noted to all pedal and ankle joints.   Assesement: #1 Paronychia with ingrowing nail RT 2nd med and lat. LT 3rd med.  #2 Pain in toe  Plan of Care:  1. Patient evaluated.  2. Discussed treatment alternatives and plan of care. Explained nail avulsion procedure and post procedure course to patient. 3. Patient opted for permanent partial nail avulsion of the ingrown portion of the nail.  4. Prior to procedure, local anesthesia infiltration utilized using 3 ml of a 50:50 mixture of 2% plain lidocaine and 0.5% plain marcaine in a normal hallux block fashion and a betadine prep performed.  5. Partial permanent nail avulsion with chemical matrixectomy performed using 3x30sec applications of phenol followed by alcohol  flush.  6. Light dressing applied.  Post care instructions provided 7.  Prescription for gentamicin 2% cream  8.  Prescription for doxycycline 100 mg 2 times daily #20  9.  Return to clinic 2 weeks.  Felecia Shelling, DPM [facility]  Dr. Felecia Shelling, DPM    [address] [place], [place] [address]                Office [phone]  Fax [phone]

## 2020-11-23 ENCOUNTER — Ambulatory Visit: Payer: Medicaid Other | Admitting: Podiatry

## 2021-01-20 ENCOUNTER — Encounter: Payer: Self-pay | Admitting: Podiatry

## 2021-01-20 ENCOUNTER — Ambulatory Visit: Payer: Medicaid Other | Admitting: Podiatry

## 2021-01-20 ENCOUNTER — Other Ambulatory Visit: Payer: Self-pay

## 2021-01-20 DIAGNOSIS — L600 Ingrowing nail: Secondary | ICD-10-CM

## 2021-01-20 NOTE — Progress Notes (Signed)
° °  Subjective: Patient presents today for evaluation of pain to the lateral border of the left third toe as well as the medial border of the right fourth toe. Patient is concerned for possible ingrown nail.  It is very sensitive to touch.  Patient has a longstanding chronic history of bilateral and recurrent ingrown toenails.  She has had multiple nail matricectomy's with phenol application and she continues to have problems with the toenails bilaterally.  Patient presents today for further treatment and evaluation.  Past Medical History:  Diagnosis Date   Chronic otitis media 12/2013   Mild intellectual disability    Nasal congestion 12/18/2013   Recurrent dislocation of knee    bilateral    Objective:  General: Well developed, nourished, in no acute distress, alert and oriented x3   Dermatology: Skin is warm, dry and supple bilateral.  Lateral border left third toe.  Medial border right fourth toe.  Appears to be erythematous with evidence of an ingrowing nail. Pain on palpation noted to the border of the nail fold. The remaining nails appear unremarkable at this time. There are no open sores, lesions.  Vascular: Dorsalis Pedis artery and Posterior Tibial artery pedal pulses palpable. No lower extremity edema noted.   Neruologic: Grossly intact via light touch bilateral.  Musculoskeletal: Muscular strength within normal limits in all groups bilateral. Normal range of motion noted to all pedal and ankle joints.   Assesement: #1 Paronychia with ingrowing nail lateral border left third toe.  Medial border right fourth toe #2 Pain in toe  Plan of Care:  1. Patient evaluated.  2. Discussed treatment alternatives and plan of care. Explained nail avulsion procedure and post procedure course to patient.  Instead of phenol application the patient would just like the nails removed and allow the new nail to grow in.  I am okay with this regimen.  I explained to the patient that there is a chance  of recurrence of the ingrown but the patient would like to pursue this option today. 3. Patient opted for total temporary nail avulsion of the respective nails.  The toes were prepped in aseptic manner and digital block performed using 3 mL of 2% lidocaine plain.  The nails were avulsed in their entirety and light dressing applied.  Post care instructions provided. 4.  Return to clinic in 2 weeks for follow-up  Edrick Kins, DPM [facility]  Dr. Edrick Kins, DPM    [address], [place] [address]                Office [phone]  Fax [phone]

## 2021-02-03 ENCOUNTER — Encounter: Payer: Self-pay | Admitting: Podiatry

## 2021-02-03 ENCOUNTER — Ambulatory Visit: Payer: Medicaid Other | Admitting: Podiatry

## 2021-02-03 ENCOUNTER — Other Ambulatory Visit: Payer: Self-pay

## 2021-02-03 DIAGNOSIS — L600 Ingrowing nail: Secondary | ICD-10-CM

## 2021-02-03 NOTE — Progress Notes (Signed)
Subjective: Patient presents today for follow-up evaluation regarding total temporary nail avulsions to the bilateral feet.  The left second toe and right fourth toe were temporarily avulsed to allow for a new nail to grow in.  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  She says that the toes with the nail avulsions are doing very well.  She has no pain associated to them.  Unfortunately she says that she is getting pain and tenderness associated to the lateral border of the right great toe.  She has history of ingrown toenail procedures and she would like to have the toenail completely avulsed permanently today.  She presents for further treatment evaluation  Past Medical History:  Diagnosis Date   Chronic otitis media 12/2013   Mild intellectual disability    Nasal congestion 12/18/2013   Recurrent dislocation of knee    bilateral    Objective:  General: Well developed, nourished, in no acute distress, alert and oriented x3   Dermatology: Skin is warm, dry and supple bilateral.  The nail avulsion sites to the left second and right fourth appear to be healing appropriately.  There is no inflammation around these toes.  There is some inflammation and tenderness to the lateral aspect of the right great toe.  No drainage but there is erythema and evidence of an ingrowing nail again to this area.  There is also some pain and tenderness associated to the left fourth toe.  She is concerned for possible ingrown toenail of the spine as well.  It is very tender to palpation.  Vascular: Dorsalis Pedis artery and Posterior Tibial artery pedal pulses palpable. No lower extremity edema noted.   Neruologic: Grossly intact via light touch bilateral.  Musculoskeletal: Muscular strength within normal limits in all groups bilateral. Normal range of motion noted to all pedal and ankle joints.   Assesement: #1 Paronychia with ingrowing nail lateral border right great  toe; recurrent #2  Paronychia left fourth toe  Plan of Care:  1. Patient evaluated.  2. Discussed treatment alternatives and plan of care. Explained nail avulsion procedure and post procedure course to patient. 3. Patient opted for permanent total nail avulsion of the right hallux nail plate.  The patient states that she is willing to not have a toenail there anymore.  We have unfortunately addressed multiple recurrent ingrown's to this area and she would like the nail completely avulsed.   4. Prior to procedure, local anesthesia infiltration utilized using 3 ml of a 50:50 mixture of 2% plain lidocaine and 0.5% plain marcaine in a normal hallux block fashion and a betadine prep performed.  5.  Total permanent nail avulsion with chemical matrixectomy performed using 3x30sec applications of phenol followed by alcohol flush.  6. Light dressing applied.  Post care instructions provided 7.  Prescription for gentamicin 2% cream  8.  Light debridement of the left fourth toe was also performed using nail nipper.  Recommend daily Epsom salt soaks and gentamicin cream to this area as well  9.  Return to clinic 3 weeks.  *Presented today with her father  Felecia Shelling, DPM [facility]  Dr. Felecia Shelling, DPM    [address], [place] [address]  Office [phone]  Fax [phone]

## 2021-02-08 ENCOUNTER — Encounter: Payer: Self-pay | Admitting: *Deleted

## 2021-02-24 ENCOUNTER — Ambulatory Visit: Payer: Medicaid Other | Admitting: Podiatry

## 2021-03-03 ENCOUNTER — Ambulatory Visit: Payer: Medicaid Other | Admitting: Podiatry

## 2021-03-03 ENCOUNTER — Encounter: Payer: Self-pay | Admitting: Podiatry

## 2021-03-03 ENCOUNTER — Other Ambulatory Visit: Payer: Self-pay

## 2021-03-03 DIAGNOSIS — L600 Ingrowing nail: Secondary | ICD-10-CM | POA: Diagnosis not present

## 2021-03-03 NOTE — Progress Notes (Signed)
? ?  HPI: 26 y.o. female presenting today for follow-up evaluation following total permanent nail avulsion to the right hallux nail plate was performed on 02/03/2021.  Patient states that she is doing well.  It was very painful and sensitive for the first few weeks.  Over the last few days it has improved.  She presents with her mother today for further treatment and evaluation ? ?Past Medical History:  ?Diagnosis Date  ? Chronic otitis media 12/2013  ? Mild intellectual disability   ? Nasal congestion 12/18/2013  ? Recurrent dislocation of knee   ? bilateral  ? ? ?Past Surgical History:  ?Procedure Laterality Date  ? ADENOIDECTOMY    ? COLONOSCOPY W/ BIOPSIES  02/10/2010  ? MYRINGOTOMY WITH TUBE PLACEMENT Bilateral 12/22/2013  ? Procedure: BILATERAL MYRINGOTOMY WITH TUBE PLACEMENT;  Surgeon: Darletta Moll, MD;  Location: [facility];  Service: ENT;  Laterality: Bilateral;  ? TYMPANOSTOMY TUBE PLACEMENT Bilateral   ? x 2  ? ? ?No Known Allergies ?  ?Physical Exam: ?General: The patient is alert and oriented x3 in no acute distress. ? ?Dermatology: The right hallux nail bed appears to be healing appropriately.  Open wound noted with a granular wound base which appears to be healing uneventfully.  No drainage.  No clinical evidence of infection.  Remaining integument unremarkable ? ?Vascular: Palpable pedal pulses bilaterally. Capillary refill within normal limits.  Negative for any significant edema or erythema ? ?Neurological: Light touch and protective threshold grossly intact ? ?Musculoskeletal Exam: No pedal deformities noted ? ?Assessment: ?1.  Status post total permanent nail avulsion right hallux nail plate ? ? ?Plan of Care:  ?1. Patient evaluated.  ?2.  Continue antibiotic cream and a light Band-Aid daily until resolved ?3.  Patient has follow-up appointment with rheumatology end of April.  PCP drew blood work which was ANA positive.  Patient's mother is thinking that there is a possible relation  between the chronic recurrent ingrown toenails and autoimmune. ?4.  Return to clinic as needed ?  ?  ?Felecia Shelling, DPM ?[facility] ? ?Dr. Felecia Shelling, DPM  ?  ?[address]                                        ?[place], [place] [address]                ?Office [phone]  ?Fax [phone] ? ? ? ? ?

## 2021-04-26 ENCOUNTER — Encounter: Payer: Self-pay | Admitting: Internal Medicine

## 2021-04-26 ENCOUNTER — Ambulatory Visit (INDEPENDENT_AMBULATORY_CARE_PROVIDER_SITE_OTHER): Payer: Medicaid Other | Admitting: Internal Medicine

## 2021-04-26 VITALS — BP 116/75 | HR 85 | Resp 15 | Ht 64.0 in | Wt 166.0 lb

## 2021-04-26 DIAGNOSIS — R768 Other specified abnormal immunological findings in serum: Secondary | ICD-10-CM | POA: Diagnosis not present

## 2021-04-26 DIAGNOSIS — L600 Ingrowing nail: Secondary | ICD-10-CM

## 2021-04-26 DIAGNOSIS — R7982 Elevated C-reactive protein (CRP): Secondary | ICD-10-CM

## 2021-04-26 DIAGNOSIS — M25469 Effusion, unspecified knee: Secondary | ICD-10-CM | POA: Diagnosis not present

## 2021-04-26 NOTE — Progress Notes (Signed)
? ?Office Visit Note ? ?Patient: Carolyn Benitez             ?Date of Birth: 06-16-1995           ?MRN: [id number]             ?PCP: Erasmo Downer, NP ?Referring: Erasmo Downer, NP ?Visit Date: 04/26/2021 ?Occupation: Day care, toddlers ? ?Subjective:  ?New Patient (Initial Visit) (Abnormal labs) ? ? ?History of Present Illness: Carolyn Benitez is a 26 y.o. female here for evaluation of positive ANA and elevated CRP. She previously had unremarkable lab workup in 2021 due to persistent toe and skin inflammation. Problems are mostly for the past 2-3 years she had multiple ingrown toenail surgeries and antibiotic treatments for paronychia. She had permanent removal of right 1st toenail for this. In addition she has experienced some skin blistering and peeling on her foot and ankle. 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 Sometimes knees will flare up with pain and hot to the touch. She takes ibuprofen as needed which helps. ? ?Labs reviewed ?ANA 1:80 speckled ?CRP 20.1 ? ?Activities of Daily Living:  ?Patient reports morning stiffness for 0  none .   ?Patient Reports nocturnal pain.  ?Difficulty dressing/grooming: Denies ?Difficulty climbing stairs: Reports ?Difficulty getting out of chair: Denies ?Difficulty using hands for taps, buttons, cutlery, and/or writing: Denies ? ?Review of Systems  ?Constitutional:  Positive for fatigue.  ?HENT:  Negative for mouth dryness.   ?Eyes:  Negative for dryness.  ?Respiratory:  Negative for shortness of breath.   ?Cardiovascular:  Positive for swelling in legs/feet.  ?Gastrointestinal:  Negative for constipation.  ?Endocrine: Positive for cold intolerance.  ?Genitourinary:  Negative for difficulty urinating.  ?Musculoskeletal:  Positive for joint pain, joint pain and joint swelling.  ?Skin:  Positive for rash.  ?Allergic/Immunologic: Positive for susceptible to infections.   ?Neurological:  Positive for numbness and weakness.  ?Hematological:  Negative for bruising/bleeding tendency.  ?Psychiatric/Behavioral:  Positive for sleep disturbance.   ? ?PMFS History:  ?Patient Active Problem List  ? Diagnosis Date Noted  ? Ingrown toenail of both feet 04/26/2021  ? Knee swelling 04/26/2021  ? Positive ANA (antinuclear antibody) 04/26/2021  ? CRP elevated 04/26/2021  ? Closed traumatic dislocation of patellofemoral joint 06/16/2020  ? 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  ?Past Medical History:  ?Diagnosis Date  ? Chronic otitis media 12/2013  ? Mild intellectual disability   ? Nasal congestion 12/18/2013  ? Recurrent dislocation of knee   ? bilateral  ?  ?Family History  ?Problem Relation Age of Onset  ? Hypertension Paternal Grandfather   ? ?Past Surgical History:  ?Procedure Laterality Date  ? ADENOIDECTOMY    ? COLONOSCOPY W/ BIOPSIES  02/10/2010  ? KNEE SURGERY Right   ? MYRINGOTOMY WITH TUBE PLACEMENT Bilateral 12/22/2013  ? Procedure: BILATERAL MYRINGOTOMY WITH TUBE PLACEMENT;  Surgeon: Darletta Moll, MD;  Location: [facility];  Service: ENT;  Laterality: Bilateral;  ? TYMPANOSTOMY TUBE PLACEMENT Bilateral   ? x 2  ? ?Social History  ? ?Social History Narrative  ? Not on file  ? ? ?There is no immunization history on file for this patient.  ? ?Objective: ?Vital Signs: BP 116/75 (BP Location: Right Arm, Patient Position: Sitting, Cuff Size: Normal)   Pulse 85   Resp 15   Ht 5\' 4"  (1.626 m)   Wt 166 lb (75.3 kg)  BMI 28.49 kg/m?   ? ?Physical Exam ?Eyes:  ?   Conjunctiva/sclera: Conjunctivae normal.  ?Cardiovascular:  ?   Rate and Rhythm: Normal rate and regular rhythm.  ?Pulmonary:  ?   Effort: Pulmonary effort is normal.  ?   Breath sounds: Normal breath sounds.  ?Musculoskeletal:  ?   Right lower leg: No edema.  ?   Left lower leg: No edema.  ?Skin: ?   General: Skin is warm and dry.  ?   Comments: Appears to be resolving blister on medial side of right ankle ?Toenails and  fingernails are painted ?No digital pitting  ?Neurological:  ?   Mental Status: She is alert.  ?Psychiatric:     ?   Mood and Affect: Mood normal.  ?  ? ?Musculoskeletal Exam:  ?Shoulders full ROM no tenderness or swelling ?Elbows full ROM no tenderness or swelling ?Wrists full ROM no tenderness or swelling ?Fingers full ROM no tenderness or swelling ?Left knee brace, current tenderness around joint anteriorly ?Ankles full ROM no tenderness or swelling ?MTPs full ROM no tenderness or swelling ? ? ?Investigation: ?No additional findings. ? ?Imaging: ?No results found. ? ?Recent Labs: ?Lab Results  ?Component Value Date  ? WBC 5.3 08/24/2019  ? HGB 11.9 (L) 08/24/2019  ? PLT 260 08/24/2019  ? NA 138 08/24/2019  ? K 3.8 08/24/2019  ? CL 107 08/24/2019  ? CO2 25 08/24/2019  ? GLUCOSE 119 (H) 08/24/2019  ? BUN 11 08/24/2019  ? CREATININE 0.59 08/24/2019  ? CALCIUM 8.8 (L) 08/24/2019  ? GFRAA >60 08/24/2019  ? ? ?Speciality Comments: No specialty comments available. ? ?Procedures:  ?No procedures performed ?Allergies: Patient has no known allergies.  ? ?Assessment / Plan:     ?Visit Diagnoses: Positive ANA (antinuclear antibody) - Plan: Sedimentation rate, C-reactive protein, Anti-DNA antibody, double-stranded, Anti-Smith antibody, Sjogrens syndrome-A extractable nuclear antibody, RNP Antibody ? ?Positive ANA rechecking titer today also specific ENAs. Symptoms are not entirely typical for any systemic CTD. Knee inflammation with intermittent heat and effusions, swelling in heels suggestive for enthesitis. Toe swelling does not sound like dactylitis. Cannot rule our nail dystrophy that could fit with psoriatic disease or spondyloarthropathy. ? ?Knee swelling ? ?Also known structural abnormality with patellar instability could explain all the knee symptoms. No current effusion. ? ?CRP elevated ? ?Repeating inflammatory markers today. Previous CRP could have been related to the preceding nail removal procedure or ingrown  nail pain around that time. ? ?Orders: ?Orders Placed This Encounter  ?Procedures  ? Sedimentation rate  ? C-reactive protein  ? Anti-DNA antibody, double-stranded  ? Anti-Smith antibody  ? Sjogrens syndrome-A extractable nuclear antibody  ? RNP Antibody  ? ?No orders of the defined types were placed in this encounter. ? ? ?Follow-Up Instructions: Return if symptoms worsen or fail to improve. ? ? ?Fuller Plan, MD ? ?Note - This record has been created using [employer].  ?Chart creation errors have been sought, but may not always  ?have been located. Such creation errors do not reflect on  ?the standard of medical care. ? ?

## 2021-04-27 LAB — ANTI-DNA ANTIBODY, DOUBLE-STRANDED: ds DNA Ab: <1 IU/mL

## 2021-04-27 LAB — SJOGRENS SYNDROME-A EXTRACTABLE NUCLEAR ANTIBODY: SSA (Ro) (ENA) Antibody, IgG: <1 AI

## 2021-04-27 LAB — RNP ANTIBODY: Ribonucleic Protein(ENA) Antibody, IgG: <1 AI

## 2021-04-27 LAB — C-REACTIVE PROTEIN: CRP: 36.7 mg/L — ABNORMAL HIGH (ref <8.0)

## 2021-04-27 LAB — SEDIMENTATION RATE: Sed Rate: 33 mm/h — ABNORMAL HIGH (ref 0–20)

## 2021-04-27 LAB — ANTI-SMITH ANTIBODY: ENA SM Ab Ser-aCnc: <1 AI

## 2021-05-04 NOTE — Progress Notes (Signed)
Lab results show inflammatory markers that are higher than before, even though she is not in a flare up or any recent procedure. We should schedule a follow up appointment to take another look and discuss the plan and medication options.

## 2021-05-09 NOTE — Progress Notes (Signed)
? ?Office Visit Note ? ?Patient: Carolyn Benitez             ?Date of Birth: [birth year]           ?MRN: [id number]             ?PCP: Erasmo Downer, NP ?Referring: Erasmo Downer, NP ?Visit Date: 05/10/2021 ? ? ?Subjective:  ?Medication Management (Not doing well, right foot 4th digit ingrown toe nail, left knee pain, swelling, warm to the touch, left foot and ankle pain, swelling) ? ? ?History of Present Illness: Carolyn Benitez is a 26 y.o. female here for follow up for positive ANA and elevated CRP. Since our last visit she has return of symptoms right 4th toe swelling pain and redness and left knee pain and swelling occurred again. Left foot and ankle pain as well but no swollen toes on this side. She has not seen any drainage from toenail edges. Lab tests at initial visit with increased in inflammatory markers. ? ?Previous HPI ?04/26/2021 ? Carolyn Benitez is a 26 y.o. female here for evaluation of positive ANA and elevated CRP. She previously had unremarkable lab workup in 2021 due to persistent toe and skin inflammation. Problems are mostly for the past 2-3 years she had multiple ingrown toenail surgeries and antibiotic treatments for paronychia. She had permanent removal of right 1st toenail for this. In addition she has experienced some skin blistering and peeling on her foot and ankle. 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 Sometimes knees will flare up with pain and hot to the touch. She takes ibuprofen as needed which helps. ?  ?Labs reviewed ?ANA 1:80 speckled ?CRP 20.1 ? ? ?Review of Systems  ?Constitutional:  Positive for fatigue.  ?HENT:  Negative for mouth dryness.   ?Eyes:  Negative for dryness.  ?Respiratory:  Negative for shortness of breath.   ?Cardiovascular:  Positive for swelling in legs/feet.  ?Gastrointestinal:  Negative for constipation.  ?Endocrine: Positive for cold intolerance.   ?Genitourinary:  Negative for difficulty urinating.  ?Musculoskeletal:  Positive for joint pain, joint pain, joint swelling and morning stiffness.  ?Skin:  Negative for rash.  ?Allergic/Immunologic: Negative for susceptible to infections.  ?Neurological:  Negative for numbness.  ?Hematological:  Negative for bruising/bleeding tendency.  ?Psychiatric/Behavioral:  Negative for sleep disturbance.   ? ?PMFS History:  ?Patient Active Problem List  ? Diagnosis Date Noted  ? Seronegative inflammatory arthritis 05/10/2021  ? High risk medication use 05/10/2021  ? Ingrown toenail of both feet 04/26/2021  ? Knee swelling 04/26/2021  ? Positive ANA (antinuclear antibody) 04/26/2021  ? CRP elevated 04/26/2021  ? Closed traumatic dislocation of patellofemoral joint 06/16/2020  ? 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  ?Past Medical History:  ?Diagnosis Date  ? Chronic otitis media 12/2013  ? Mild intellectual disability   ? Nasal congestion 12/18/2013  ? Recurrent dislocation of knee   ? bilateral  ?  ?Family History  ?Problem Relation Age of Onset  ? Hypertension Paternal Grandfather   ? ?Past Surgical History:  ?Procedure Laterality Date  ? ADENOIDECTOMY    ? COLONOSCOPY W/ BIOPSIES  02/10/2010  ? KNEE SURGERY Right   ? MYRINGOTOMY WITH TUBE PLACEMENT Bilateral 12/22/2013  ? Procedure: BILATERAL MYRINGOTOMY WITH TUBE PLACEMENT;  Surgeon: Darletta Moll, MD;  Location: [facility];  Service: ENT;  Laterality: Bilateral;  ? TYMPANOSTOMY TUBE PLACEMENT Bilateral   ? x 2  ? ?  Social History  ? ?Social History Narrative  ? Not on file  ? ? ?There is no immunization history on file for this patient.  ? ?Objective: ?Vital Signs: BP 116/72 (BP Location: Left Arm, Patient Position: Sitting, Cuff Size: Normal)   Pulse 87   Resp 14   Ht 5\' 4"  (1.626 m)   Wt 167 lb (75.8 kg)   BMI 28.67 kg/m?   ? ?Physical Exam ?Cardiovascular:  ?   Rate and Rhythm: Normal rate and regular rhythm.  ?Pulmonary:  ?   Effort: Pulmonary effort is normal.   ?   Breath sounds: Normal breath sounds.  ?Skin: ?   General: Skin is warm and dry.  ?   Findings: No rash.  ?Neurological:  ?   Mental Status: She is alert.  ?Psychiatric:     ?   Mood and Affect: Mood normal.  ?  ? ?Musculoskeletal Exam:  ?Left knee tenderness to palpation with normal range of motion and no appreciable swelling ?Left ankle and heel tenderness ?Right fourth toe with erythema and swelling the distal half of the toe without any local lesions or visible nail changes ? ? ?Investigation: ?No additional findings. ? ?Imaging: ?No results found. ? ?Recent Labs: ?Lab Results  ?Component Value Date  ? WBC 5.3 08/24/2019  ? HGB 11.9 (L) 08/24/2019  ? PLT 260 08/24/2019  ? NA 138 08/24/2019  ? K 3.8 08/24/2019  ? CL 107 08/24/2019  ? CO2 25 08/24/2019  ? GLUCOSE 119 (H) 08/24/2019  ? BUN 11 08/24/2019  ? CREATININE 0.59 08/24/2019  ? CALCIUM 8.8 (L) 08/24/2019  ? GFRAA >60 08/24/2019  ? ? ?Speciality Comments: No specialty comments available. ? ?Procedures:  ?No procedures performed ?Allergies: Patient has no known allergies.  ? ?Assessment / Plan:     ?Visit Diagnoses: Seronegative inflammatory arthritis - Plan: sulfaSALAzine (AZULFIDINE) 500 MG tablet ? ?Positive ANA without specific other antibody markers but significantly increased sed rate and especially CRP.  I do not know that nail changes represent and inflammatory cause of nail dystrophy such as psoriasis.  However would proceed treatment for this or other seronegative inflammatory arthritis or spondylarthritis picture.  Starting sulfasalazine at 500 mg twice daily titrate up to 1000 mg twice daily. ? ?CRP elevated ? ?Knee swelling - Also known structural abnormality with patellar instability could explain all the knee symptoms.  Increase in swelling in a short amount of time along with the other symptoms now I suspect is more suggestive of an ongoing inflammatory issue. ? ?High risk medication use - Plan: CBC with Differential/Platelet, COMPLETE  METABOLIC PANEL WITH GFR ? ?Checking CBC and CMP baseline for sulfasalazine medication monitoring.  Discussed risks of medication including GI upset severe allergic reaction cytopenias.  Discussed medication not safe during pregnancy she has no short-term future plans. ? ?Orders: ?Orders Placed This Encounter  ?Procedures  ? CBC with Differential/Platelet  ? COMPLETE METABOLIC PANEL WITH GFR  ? ?Meds ordered this encounter  ?Medications  ? sulfaSALAzine (AZULFIDINE) 500 MG tablet  ?  Sig: Take 1 tablet (500 mg total) by mouth 2 (two) times daily for 14 days, THEN 2 tablets (1,000 mg total) 2 (two) times daily for 28 days.  ?  Dispense:  140 tablet  ?  Refill:  0  ? ? ? ?Follow-Up Instructions: Return in about 5 weeks (around 06/14/2021) for Arthritis SSZ start f/u 4-6wks. ? ? ?Fuller Planhristopher W Amarii Bordas, MD ? ?Note - This record has been created using [employer] software.  ?  Chart creation errors have been sought, but may not always  ?have been located. Such creation errors do not reflect on  ?the standard of medical care.  ?

## 2021-05-10 ENCOUNTER — Ambulatory Visit (INDEPENDENT_AMBULATORY_CARE_PROVIDER_SITE_OTHER): Payer: Medicaid Other | Admitting: Internal Medicine

## 2021-05-10 ENCOUNTER — Encounter: Payer: Self-pay | Admitting: Internal Medicine

## 2021-05-10 VITALS — BP 116/72 | HR 87 | Resp 14 | Ht 64.0 in | Wt 167.0 lb

## 2021-05-10 DIAGNOSIS — M1380 Other specified arthritis, unspecified site: Secondary | ICD-10-CM

## 2021-05-10 DIAGNOSIS — Z79899 Other long term (current) drug therapy: Secondary | ICD-10-CM

## 2021-05-10 DIAGNOSIS — R7982 Elevated C-reactive protein (CRP): Secondary | ICD-10-CM

## 2021-05-10 DIAGNOSIS — M25469 Effusion, unspecified knee: Secondary | ICD-10-CM | POA: Diagnosis not present

## 2021-05-10 DIAGNOSIS — R768 Other specified abnormal immunological findings in serum: Secondary | ICD-10-CM | POA: Diagnosis not present

## 2021-05-10 MED ORDER — SULFASALAZINE 500 MG PO TABS: ORAL_TABLET | ORAL | 0 refills | Status: DC

## 2021-05-10 NOTE — Patient Instructions (Signed)
Sulfasalazine Tablets ?What is this medication? ?SULFASALAZINE (sul fa SAL a zeen) treats ulcerative colitis. It works by decreasing inflammation. It belongs to a group of medications called salicylates. ?This medicine may be used for other purposes; ask your health care provider or pharmacist if you have questions. ?COMMON BRAND NAME(S): Azulfidine, Sulfazine ?What should I tell my care team before I take this medication? ?They need to know if you have any of these conditions: ?Asthma ?Blood disorders or anemia ?Glucose-6-phosphate dehydrogenase (G6PD) deficiency ?Intestinal obstruction ?Kidney disease ?Liver disease ?Porphyria ?Urinary tract obstruction ?An unusual reaction to sulfasalazine, sulfa medications, salicylates, or other medications, foods, dyes, or preservatives ?Pregnant or trying to get pregnant ?Breast-feeding ?How should I use this medication? ?Take this medication by mouth with a full glass of water. Take it as directed on the prescription label at the same time every day. You can take it with or without food. If it upsets your stomach, take it with food. Keep taking it unless your care team tells you to stop. ?Talk to your care team about the use of this medication in children. While this medication may be prescribed for children as young as 6 years for selected conditions, precautions do apply. ?Patients over 65 years old may have a stronger reaction and need a smaller dose. ?Overdosage: If you think you have taken too much of this medicine contact a poison control center or emergency room at once. ?NOTE: This medicine is only for you. Do not share this medicine with others. ?What if I miss a dose? ?If you miss a dose, take it as soon as you can. If it is almost time for your next dose, take only that dose. Do not take double or extra doses. ?What may interact with this medication? ?Digoxin ?Folic acid ?This list may not describe all possible interactions. Give your health care provider a list  of all the medicines, herbs, non-prescription drugs, or dietary supplements you use. Also tell them if you smoke, drink alcohol, or use illegal drugs. Some items may interact with your medicine. ?What should I watch for while using this medication? ?Visit your care team for regular checks on your progress. Tell your care team if your symptoms do not start to get better or if they get worse. ?You will need frequent blood and urine checks. ?This medication can make you more sensitive to the sun. Keep out of the sun. If you cannot avoid being in the sun, wear protective clothing and use sunscreen. Do not use sun lamps or tanning beds/booths. ?Drink plenty of water while taking this medication. ?What side effects may I notice from receiving this medication? ?Side effects that you should report to your care team as soon as possible: ?Allergic reactions--skin rash, itching, hives, swelling of the face, lips, tongue, or throat ?Aplastic anemia--unusual weakness or fatigue, dizziness, headache, trouble breathing, increased bleeding or bruising ?Dry cough, shortness of breath or trouble breathing ?Heart muscle inflammation--unusual weakness or fatigue, shortness of breath, chest pain, fast or irregular heartbeat, dizziness, swelling of the ankles, feet, or hands ?Infection--fever, chills, cough, sore throat, wounds that don't heal, pain or trouble when passing urine, general feeling of discomfort or being unwell ?Kidney injury--decrease in the amount of urine, swelling of the ankles, hands, or feet ?Liver injury--right upper belly pain, loss of appetite, nausea, light-colored stool, dark yellow or brown urine, yellowing skin or eyes, unusual weakness or fatigue ?Rash, fever, and swollen lymph nodes ?Redness, blistering, peeling, or loosening of the skin, including inside   the mouth ?Side effects that usually do not require medical attention (report to your care team if they continue or are bothersome): ?Dark yellow or orange  saliva, sweat, or urine ?Dizziness ?Headache ?Loss of appetite ?Nausea ?Upset stomach ?Vomiting ?This list may not describe all possible side effects. Call your doctor for medical advice about side effects. You may report side effects to FDA at 1-800-FDA-1088. ?Where should I keep my medication? ?Keep out of the reach of children and pets. ?Store at room temperature between 15 and 30 degrees C (59 and 86 degrees F). Get rid of any unused medication after the expiration date. ?To get rid of medications that are no longer needed or have expired: ?Take the medications to a medication take-back program. Check with your pharmacy or law enforcement to find a location. ?If you cannot return the medication, check the label or package insert to see if the medication should be thrown out in the garbage or flushed down the toilet. If you are not sure, ask your care team. If it is safe to put it in the trash, take the medication out of the container. Mix the medication with cat litter, dirt, coffee grounds, or other unwanted substance. Seal the mixture in a bag or container. Put it in the trash. ?NOTE: This sheet is a summary. It may not cover all possible information. If you have questions about this medicine, talk to your doctor, pharmacist, or health care provider. ?? 2023 Elsevier/Gold Standard (2020-11-10 00:00:00) ? ?

## 2021-05-11 LAB — COMPLETE METABOLIC PANEL WITH GFR
AG Ratio: 1.5 (calc) (ref 1.0–2.5)
ALT: 19 U/L (ref 6–29)
AST: 19 U/L (ref 10–30)
Albumin: 4.2 g/dL (ref 3.6–5.1)
Alkaline phosphatase (APISO): 91 U/L (ref 31–125)
BUN: 9 mg/dL (ref 7–25)
CO2: 25 mmol/L (ref 20–32)
Calcium: 9.9 mg/dL (ref 8.6–10.2)
Chloride: 109 mmol/L (ref 98–110)
Creat: 0.73 mg/dL (ref 0.50–0.96)
Globulin: 2.8 g/dL (calc) (ref 1.9–3.7)
Glucose, Bld: 94 mg/dL (ref 65–99)
Potassium: 5 mmol/L (ref 3.5–5.3)
Sodium: 141 mmol/L (ref 135–146)
Total Bilirubin: 0.4 mg/dL (ref 0.2–1.2)
Total Protein: 7 g/dL (ref 6.1–8.1)
eGFR: 118 mL/min/{1.73_m2} (ref >=60)

## 2021-05-11 LAB — CBC WITH DIFFERENTIAL/PLATELET
Absolute Monocytes: 428 cells/uL (ref 200–950)
Basophils Absolute: 41 cells/uL (ref 0–200)
Basophils Relative: 0.9 %
Eosinophils Absolute: 230 cells/uL (ref 15–500)
Eosinophils Relative: 5 %
HCT: 37.8 % (ref 35.0–45.0)
Hemoglobin: 12.5 g/dL (ref 11.7–15.5)
Lymphs Abs: 966 cells/uL (ref 850–3900)
MCH: 29.3 pg (ref 27.0–33.0)
MCHC: 33.1 g/dL (ref 32.0–36.0)
MCV: 88.5 fL (ref 80.0–100.0)
MPV: 12.2 fL (ref 7.5–12.5)
Monocytes Relative: 9.3 %
Neutro Abs: 2935 cells/uL (ref 1500–7800)
Neutrophils Relative %: 63.8 %
Platelets: 302 10*3/uL (ref 140–400)
RBC: 4.27 10*6/uL (ref 3.80–5.10)
RDW: 13.2 % (ref 11.0–15.0)
Total Lymphocyte: 21 %
WBC: 4.6 10*3/uL (ref 3.8–10.8)

## 2021-05-11 NOTE — Progress Notes (Signed)
Lab results look fine for starting sulfasalazine as planned.

## 2021-06-06 ENCOUNTER — Other Ambulatory Visit: Payer: Self-pay | Admitting: Internal Medicine

## 2021-06-06 DIAGNOSIS — M1380 Other specified arthritis, unspecified site: Secondary | ICD-10-CM

## 2021-06-06 NOTE — Telephone Encounter (Signed)
Next Visit: 06/21/2021  Last Visit: 05/10/2021  Last Fill: 05/10/2021  DX: Seronegative inflammatory arthritis   Current Dose per office note 05/10/2021: Starting sulfasalazine at 500 mg twice daily titrate up to 1000 mg twice daily.  Labs: 05/10/2021 Lab results look fine for starting sulfasalazine as planned.  Okay to refill SSZ?

## 2021-06-21 ENCOUNTER — Encounter: Payer: Self-pay | Admitting: Internal Medicine

## 2021-06-21 ENCOUNTER — Ambulatory Visit: Payer: Medicaid Other | Admitting: Internal Medicine

## 2021-06-21 VITALS — BP 118/77 | HR 67 | Resp 12 | Ht 64.0 in | Wt 168.8 lb

## 2021-06-21 DIAGNOSIS — Z79899 Other long term (current) drug therapy: Secondary | ICD-10-CM

## 2021-06-21 DIAGNOSIS — M1380 Other specified arthritis, unspecified site: Secondary | ICD-10-CM | POA: Diagnosis not present

## 2021-06-21 DIAGNOSIS — F418 Other specified anxiety disorders: Secondary | ICD-10-CM | POA: Insufficient documentation

## 2021-06-21 DIAGNOSIS — F419 Anxiety disorder, unspecified: Secondary | ICD-10-CM | POA: Insufficient documentation

## 2021-06-21 DIAGNOSIS — R768 Other specified abnormal immunological findings in serum: Secondary | ICD-10-CM | POA: Diagnosis not present

## 2021-06-21 DIAGNOSIS — R7982 Elevated C-reactive protein (CRP): Secondary | ICD-10-CM

## 2021-06-21 DIAGNOSIS — F4320 Adjustment disorder, unspecified: Secondary | ICD-10-CM | POA: Insufficient documentation

## 2021-06-21 DIAGNOSIS — F410 Panic disorder [episodic paroxysmal anxiety] without agoraphobia: Secondary | ICD-10-CM | POA: Insufficient documentation

## 2021-06-21 DIAGNOSIS — F70 Mild intellectual disabilities: Secondary | ICD-10-CM | POA: Insufficient documentation

## 2021-06-21 DIAGNOSIS — F332 Major depressive disorder, recurrent severe without psychotic features: Secondary | ICD-10-CM | POA: Insufficient documentation

## 2021-06-21 DIAGNOSIS — R21 Rash and other nonspecific skin eruption: Secondary | ICD-10-CM

## 2021-06-21 DIAGNOSIS — T7421XA Adult sexual abuse, confirmed, initial encounter: Secondary | ICD-10-CM | POA: Insufficient documentation

## 2021-06-21 DIAGNOSIS — G4700 Insomnia, unspecified: Secondary | ICD-10-CM | POA: Insufficient documentation

## 2021-06-21 DIAGNOSIS — E038 Other specified hypothyroidism: Secondary | ICD-10-CM | POA: Insufficient documentation

## 2021-06-21 DIAGNOSIS — Z6825 Body mass index (BMI) 25.0-25.9, adult: Secondary | ICD-10-CM | POA: Insufficient documentation

## 2021-06-21 DIAGNOSIS — F4310 Post-traumatic stress disorder, unspecified: Secondary | ICD-10-CM | POA: Insufficient documentation

## 2021-06-21 MED ORDER — FLUOCINONIDE 0.05 % EX OINT: 1.0000 | TOPICAL_OINTMENT | Freq: Two times a day (BID) | CUTANEOUS | 1 refills | Status: AC | PRN
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
## 2021-06-22 LAB — CBC WITH DIFFERENTIAL/PLATELET
Absolute Monocytes: 409 cells/uL (ref 200–950)
Basophils Absolute: 70 cells/uL (ref 0–200)
Basophils Relative: 1.6 %
Eosinophils Absolute: 339 cells/uL (ref 15–500)
Eosinophils Relative: 7.7 %
HCT: 38.8 % (ref 35.0–45.0)
Hemoglobin: 12.7 g/dL (ref 11.7–15.5)
Lymphs Abs: 889 cells/uL (ref 850–3900)
MCH: 29 pg (ref 27.0–33.0)
MCHC: 32.7 g/dL (ref 32.0–36.0)
MCV: 88.6 fL (ref 80.0–100.0)
MPV: 11.9 fL (ref 7.5–12.5)
Monocytes Relative: 9.3 %
Neutro Abs: 2693 cells/uL (ref 1500–7800)
Neutrophils Relative %: 61.2 %
Platelets: 274 10*3/uL (ref 140–400)
RBC: 4.38 10*6/uL (ref 3.80–5.10)
RDW: 13.6 % (ref 11.0–15.0)
Total Lymphocyte: 20.2 %
WBC: 4.4 10*3/uL (ref 3.8–10.8)

## 2021-06-22 LAB — COMPLETE METABOLIC PANEL WITH GFR
AG Ratio: 1.4 (calc) (ref 1.0–2.5)
ALT: 18 U/L (ref 6–29)
AST: 21 U/L (ref 10–30)
Albumin: 4.2 g/dL (ref 3.6–5.1)
Alkaline phosphatase (APISO): 94 U/L (ref 31–125)
BUN: 10 mg/dL (ref 7–25)
CO2: 24 mmol/L (ref 20–32)
Calcium: 9.4 mg/dL (ref 8.6–10.2)
Chloride: 106 mmol/L (ref 98–110)
Creat: 0.85 mg/dL (ref 0.50–0.96)
Globulin: 2.9 g/dL (calc) (ref 1.9–3.7)
Glucose, Bld: 103 mg/dL — ABNORMAL HIGH (ref 65–99)
Potassium: 4.9 mmol/L (ref 3.5–5.3)
Sodium: 139 mmol/L (ref 135–146)
Total Bilirubin: 0.3 mg/dL (ref 0.2–1.2)
Total Protein: 7.1 g/dL (ref 6.1–8.1)
eGFR: 98 mL/min/{1.73_m2} (ref >=60)

## 2021-06-22 LAB — C-REACTIVE PROTEIN: CRP: 17.9 mg/L — ABNORMAL HIGH (ref <8.0)

## 2021-06-22 NOTE — Progress Notes (Signed)
Labs with no problems for continuing the sulfasalazine at current dose 1000 mg twice daily.  Her CRP is decreased to 17.9 from 36.7 a month ago, still above normal.  We should aim to follow up in another 2-3 months to recheck and hopefully see additional improvement with more time on the medication.
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
## 2021-09-27 ENCOUNTER — Encounter: Payer: Self-pay | Admitting: Internal Medicine

## 2021-09-27 ENCOUNTER — Ambulatory Visit: Payer: Medicaid Other | Attending: Internal Medicine | Admitting: Internal Medicine

## 2021-09-27 VITALS — BP 122/73 | HR 68 | Resp 14 | Ht 64.0 in | Wt 166.2 lb

## 2021-09-27 DIAGNOSIS — M1380 Other specified arthritis, unspecified site: Secondary | ICD-10-CM | POA: Diagnosis not present

## 2021-09-27 DIAGNOSIS — R7982 Elevated C-reactive protein (CRP): Secondary | ICD-10-CM | POA: Diagnosis present

## 2021-09-27 DIAGNOSIS — Z79899 Other long term (current) drug therapy: Secondary | ICD-10-CM | POA: Diagnosis present

## 2021-09-27 MED ORDER — SULFASALAZINE 500 MG PO TABS: 1000.0000 mg | ORAL_TABLET | Freq: Two times a day (BID) | ORAL | 5 refills | Status: AC

## 2021-09-28 LAB — CBC WITH DIFFERENTIAL/PLATELET
Absolute Monocytes: 387 cells/uL (ref 200–950)
Basophils Absolute: 49 cells/uL (ref 0–200)
Basophils Relative: 1 %
Eosinophils Absolute: 299 cells/uL (ref 15–500)
Eosinophils Relative: 6.1 %
HCT: 36.1 % (ref 35.0–45.0)
Hemoglobin: 12 g/dL (ref 11.7–15.5)
Lymphs Abs: 965 cells/uL (ref 850–3900)
MCH: 29.1 pg (ref 27.0–33.0)
MCHC: 33.2 g/dL (ref 32.0–36.0)
MCV: 87.6 fL (ref 80.0–100.0)
MPV: 11.6 fL (ref 7.5–12.5)
Monocytes Relative: 7.9 %
Neutro Abs: 3200 cells/uL (ref 1500–7800)
Neutrophils Relative %: 65.3 %
Platelets: 290 10*3/uL (ref 140–400)
RBC: 4.12 10*6/uL (ref 3.80–5.10)
RDW: 12.9 % (ref 11.0–15.0)
Total Lymphocyte: 19.7 %
WBC: 4.9 10*3/uL (ref 3.8–10.8)

## 2021-09-28 LAB — COMPLETE METABOLIC PANEL WITH GFR
AG Ratio: 1.4 (calc) (ref 1.0–2.5)
ALT: 17 U/L (ref 6–29)
AST: 17 U/L (ref 10–30)
Albumin: 4.4 g/dL (ref 3.6–5.1)
Alkaline phosphatase (APISO): 85 U/L (ref 31–125)
BUN: 9 mg/dL (ref 7–25)
CO2: 22 mmol/L (ref 20–32)
Calcium: 9.8 mg/dL (ref 8.6–10.2)
Chloride: 110 mmol/L (ref 98–110)
Creat: 0.79 mg/dL (ref 0.50–0.96)
Globulin: 3.1 g/dL (calc) (ref 1.9–3.7)
Glucose, Bld: 112 mg/dL — ABNORMAL HIGH (ref 65–99)
Potassium: 4.9 mmol/L (ref 3.5–5.3)
Sodium: 143 mmol/L (ref 135–146)
Total Bilirubin: 0.4 mg/dL (ref 0.2–1.2)
Total Protein: 7.5 g/dL (ref 6.1–8.1)
eGFR: 107 mL/min/{1.73_m2} (ref >=60)

## 2021-09-28 LAB — C-REACTIVE PROTEIN: CRP: 26.7 mg/L — ABNORMAL HIGH (ref <8.0)

## 2021-09-28 NOTE — Progress Notes (Signed)
CRP is still elevated actually higher than before at 26.7. Did she need to start any particular treatment for the ear symptoms?  I don't recommend adding any more medication at this time though since the joint inflammation appears to be doing well. I recommend we should schedule another follow up in about 3 months.

## 2022-10-10 ENCOUNTER — Ambulatory Visit (INDEPENDENT_AMBULATORY_CARE_PROVIDER_SITE_OTHER): Payer: MEDICAID | Admitting: Audiology

## 2022-10-10 ENCOUNTER — Encounter (INDEPENDENT_AMBULATORY_CARE_PROVIDER_SITE_OTHER): Payer: Self-pay | Admitting: Otolaryngology

## 2022-10-10 ENCOUNTER — Ambulatory Visit (INDEPENDENT_AMBULATORY_CARE_PROVIDER_SITE_OTHER): Payer: MEDICAID | Admitting: Otolaryngology

## 2022-10-10 VITALS — Ht 64.0 in | Wt 173.4 lb

## 2022-10-10 DIAGNOSIS — H903 Sensorineural hearing loss, bilateral: Secondary | ICD-10-CM

## 2022-10-10 DIAGNOSIS — H6123 Impacted cerumen, bilateral: Secondary | ICD-10-CM

## 2022-10-10 DIAGNOSIS — H906 Mixed conductive and sensorineural hearing loss, bilateral: Secondary | ICD-10-CM | POA: Diagnosis not present

## 2022-10-10 NOTE — Progress Notes (Signed)
[facility] [address] [place], [place] [address]   Audiological Evaluation    History: Carolyn Benitez was referred today for a hearing evaluation by Dr. Janeece Riggers Philomena Doheny. She was previously seen at [facility] for an audiological evaluation on September 27th, 2023.    Tympanogram: Right ear: Normal external ear canal volume with normal middle ear pressure and tympanic membrane compliance (Type A). Left ear: Normal external ear canal volume with normal middle ear pressure and tympanic membrane compliance (Type A).   Hearing Evaluation: The audiogram was completed using conventional audiometric techniques under headphones with good reliability.   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 Left ear: Moderate mixed hearing loss from 250-500 Hz rising to mild mixed hearing loss from 1000-2000 Hz sloping to profound mixed hearing loss from 3000-8000 Hz.   Speech Recognition Thresholds were obtained at 45 dBHL in the right ear and 45 dBHL in the left ear.   Word Recognition Testing was completed using the NU-6 word lists at 85 dBHL in the right ear and at 85 dBHL in the left ear and the patient scored 96% in the right ear and 96% in the left ear.   Today's test results do not suggest a significant change in hearing and word recognition scores when compared to the previous audiogram.  Recommendations: Repeat audiogram when changes are perceived or per MD. Patient is a candidate for hearing amplification, consider a hearing aid evaluation.   Carolyn Benitez, AUD, CCC-A 10/10/22

## 2022-10-11 DIAGNOSIS — H6123 Impacted cerumen, bilateral: Secondary | ICD-10-CM | POA: Insufficient documentation

## 2022-10-11 DIAGNOSIS — H903 Sensorineural hearing loss, bilateral: Secondary | ICD-10-CM | POA: Insufficient documentation

## 2022-10-11 NOTE — Progress Notes (Signed)
Patient ID: Carolyn Benitez, female   DOB: [birth year], 27 y.o.   MRN: [id number]  Follow-up: Hearing loss, recurrent ear infections, recurrent cerumen impaction  HPI: The patient is a 27 year old female who presents today with her father.  The patient has a history of bilateral high-frequency sensorineural hearing loss, eustachian tube dysfunction, and recurrent ear infections.  She also has a history of frequent cerumen impaction.  According to the patient, she had 3 episodes of otitis media over the past 3 months.  She was treated with antibiotics.  The patient returns today complaining of progressive worsening of her hearing.  Currently she denies any otalgia or otorrhea.  Exam: General: Communicates without difficulty, well nourished, no acute distress. Head: Normocephalic, no evidence injury, no tenderness, facial buttresses intact without stepoff. Eyes: PERRL, EOMI. No scleral icterus, conjunctivae clear. Neuro: CN II exam reveals vision grossly intact. No nystagmus at any point of gaze. EAC: Bilateral cerumen impaction.  Under the operating microscope, the cerumen is carefully removed with a combination of cerumen currette, alligator forceps, and suction catheters.  After the cerumen is removed, the TMs are noted to be normal.  No mass, erythema, or lesions. Nose: External evaluation reveals normal support and skin without lesions.  Dorsum is intact.  Anterior rhinoscopy reveals healthy pink mucosa over anterior aspect of inferior turbinates and intact septum.  No purulence noted. Oral:  Oral cavity and oropharynx are intact, symmetric, without erythema or edema.  Mucosa is moist without lesions. Neck: Full range of motion without pain.  There is no significant lymphadenopathy.  No masses palpable.  Thyroid bed within normal limits to palpation.  Parotid glands and submandibular glands equal bilaterally without mass.  Trachea is midline. Neuro:  CN 2-12 grossly intact. Gait normal. Vestibular: No nystagmus  at any point of gaze. The cerebellar examination is unremarkable.   AUDIOMETRIC TESTING:  Shows stable bilateral high-frequency sensorineural hearing loss. The speech reception threshold is 45dB AD and 45dB AS. The discrimination score is 96% AD and 96% AS. The tympanogram shows negative middle ear pressure bilaterally.  Assessment: 1. Bilateral cerumen impaction. Both tympanic membranes are intact and mobile without effusion. 2. There is no evidence of otitis externa or otitis media.  3. Stable bilateral high frequency hearing loss.  Plan  1. Otomicroscopy with bilateral cerumen removal. 2. The physical exam and hearing test findings are reviewed with the patient and her mother.  3. Flonase daily for her chronic eustachian tube dysfunction.  4. The patient will follow up for re-evaluation in 12 months, sooner if needed.

## 2022-11-20 ENCOUNTER — Ambulatory Visit (INDEPENDENT_AMBULATORY_CARE_PROVIDER_SITE_OTHER): Payer: MEDICAID | Admitting: Otolaryngology

## 2022-11-20 ENCOUNTER — Encounter (INDEPENDENT_AMBULATORY_CARE_PROVIDER_SITE_OTHER): Payer: Self-pay

## 2022-11-20 VITALS — Ht 65.0 in | Wt 173.0 lb

## 2022-11-20 DIAGNOSIS — H9202 Otalgia, left ear: Secondary | ICD-10-CM | POA: Diagnosis not present

## 2022-11-20 DIAGNOSIS — M26622 Arthralgia of left temporomandibular joint: Secondary | ICD-10-CM

## 2022-11-21 DIAGNOSIS — H9202 Otalgia, left ear: Secondary | ICD-10-CM | POA: Insufficient documentation

## 2022-11-21 NOTE — Progress Notes (Signed)
Patient ID: Carolyn Benitez, female   DOB: 1995/10/17, 27 y.o.   MRN: [id number]  CC: Left ear pain  HPI: The patient is a 27 year old female who returns today with her mother.  The patient has a history of recurrent ear infections.  She was previously treated with bilateral myringotomy and tube placement.  The tubes have since extruded.  At her last visit in October 2024, she was noted to have bilateral cerumen impaction.  No acute infection was noted at that time.  According to the mother, the patient started experiencing left ear pain last week.  The pain radiates down her left face.  She was seen at an urgent care center, and was diagnosed with left ear infection.  She was treated with azithromycin and antibiotic eardrops.  The patient returns today complaining of persistent left ear pain.  She denies any significant change in her hearing.  Exam: General: Communicates without difficulty, well nourished, no acute distress. Head: Normocephalic, no evidence injury, no tenderness, facial buttresses intact without stepoff. Eyes: PERRL, EOMI. No scleral icterus, conjunctivae clear. Neuro: CN II exam reveals vision grossly intact. No nystagmus at any point of gaze. EAC: The ear canals and panic membranes are noted to be normal.  No mass, erythema, or lesions.  The left TMJ is tender to palpation.  Nose: External evaluation reveals normal support and skin without lesions.  Dorsum is intact.  Anterior rhinoscopy reveals healthy pink mucosa over anterior aspect of inferior turbinates and intact septum.  No purulence noted. Oral:  Oral cavity and oropharynx are intact, symmetric, without erythema or edema.  Mucosa is moist without lesions. Neck: Full range of motion without pain.  There is no significant lymphadenopathy.  No masses palpable.  Thyroid bed within normal limits to palpation.  Parotid glands and submandibular glands equal bilaterally without mass.  Trachea is midline. Neuro:  CN 2-12 grossly intact. Gait  normal. Vestibular: No nystagmus at any point of gaze. The cerebellar examination is unremarkable.   Assessment: 1.  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  The left TMJ area is tender to palpation. 2.  Her recent left ear infection has resolved.  No significant otitis media or otitis externa is noted.  Plan: 1.  The physical exam findings are reviewed with the patient and her mother. 2.  Robaxin as needed to treat the referred otalgia. 3.  The patient may also use NSAIDs as needed. 4.  The mother is encouraged to call with any questions or concerns.
